# Patient Record
Sex: Female | Born: 1970
Health system: Southern US, Community
[De-identification: ages and names within clinical notes are randomized; demographics above are authoritative.]

## PROBLEM LIST (undated history)

## (undated) DIAGNOSIS — K219 Gastro-esophageal reflux disease without esophagitis: Secondary | ICD-10-CM

## (undated) DIAGNOSIS — G2581 Restless legs syndrome: Secondary | ICD-10-CM

## (undated) DIAGNOSIS — E7800 Pure hypercholesterolemia, unspecified: Secondary | ICD-10-CM

## (undated) DIAGNOSIS — I10 Essential (primary) hypertension: Secondary | ICD-10-CM

## (undated) DIAGNOSIS — F329 Major depressive disorder, single episode, unspecified: Secondary | ICD-10-CM

## (undated) DIAGNOSIS — K2270 Barrett's esophagus without dysplasia: Secondary | ICD-10-CM

## (undated) DIAGNOSIS — G4730 Sleep apnea, unspecified: Secondary | ICD-10-CM

## (undated) DIAGNOSIS — G8929 Other chronic pain: Secondary | ICD-10-CM

## (undated) DIAGNOSIS — G35 Multiple sclerosis: Secondary | ICD-10-CM

## (undated) DIAGNOSIS — F32A Depression, unspecified: Secondary | ICD-10-CM

## (undated) HISTORY — PX: ABDOMINAL HYSTERECTOMY: SHX81

## (undated) HISTORY — DX: Pure hypercholesterolemia, unspecified: E78.00

## (undated) HISTORY — DX: Depression, unspecified: F32.A

## (undated) HISTORY — DX: Other chronic pain: G89.29

## (undated) HISTORY — DX: Major depressive disorder, single episode, unspecified: F32.9

## (undated) HISTORY — DX: Gastro-esophageal reflux disease without esophagitis: K21.9

## (undated) HISTORY — DX: Essential (primary) hypertension: I10

## (undated) HISTORY — DX: Multiple sclerosis: G35

## (undated) HISTORY — DX: Barrett's esophagus without dysplasia: K22.70

## (undated) HISTORY — PX: TONSILLECTOMY: SUR1361

## (undated) HISTORY — DX: Restless legs syndrome: G25.81

## (undated) HISTORY — PX: TUBAL LIGATION: SHX77

---

## 2007-05-27 ENCOUNTER — Emergency Department (HOSPITAL_COMMUNITY): Admission: EM | Admit: 2007-05-27 | Discharge: 2007-05-27 | Payer: Self-pay | Admitting: Emergency Medicine

## 2010-10-31 LAB — CBC
HCT: 35.3 — ABNORMAL LOW
Hemoglobin: 12
MCHC: 34.1
MCV: 82.1
Platelets: 296
RBC: 4.3
RDW: 13.1
WBC: 6

## 2010-10-31 LAB — BASIC METABOLIC PANEL
BUN: 8
CO2: 28
Calcium: 9.1
Chloride: 105
Creatinine, Ser: 0.51
GFR calc Af Amer: >60
GFR calc non Af Amer: >60
Glucose, Bld: 104 — ABNORMAL HIGH
Potassium: 4.4
Sodium: 138

## 2010-10-31 LAB — DIFFERENTIAL
Basophils Absolute: 0
Basophils Relative: 1
Eosinophils Absolute: 0.2
Eosinophils Relative: 3
Lymphocytes Relative: 29
Lymphs Abs: 1.7
Monocytes Absolute: 0.6
Monocytes Relative: 10
Neutro Abs: 3.4
Neutrophils Relative %: 57

## 2011-08-03 DIAGNOSIS — F32A Depression, unspecified: Secondary | ICD-10-CM | POA: Insufficient documentation

## 2011-08-03 DIAGNOSIS — G35 Multiple sclerosis: Secondary | ICD-10-CM | POA: Insufficient documentation

## 2011-08-03 DIAGNOSIS — G35D Multiple sclerosis, unspecified: Secondary | ICD-10-CM | POA: Insufficient documentation

## 2011-08-03 DIAGNOSIS — I10 Essential (primary) hypertension: Secondary | ICD-10-CM | POA: Insufficient documentation

## 2011-08-03 DIAGNOSIS — K589 Irritable bowel syndrome without diarrhea: Secondary | ICD-10-CM | POA: Insufficient documentation

## 2011-09-20 DIAGNOSIS — E559 Vitamin D deficiency, unspecified: Secondary | ICD-10-CM | POA: Insufficient documentation

## 2012-01-02 DIAGNOSIS — H33329 Round hole, unspecified eye: Secondary | ICD-10-CM | POA: Insufficient documentation

## 2014-02-05 HISTORY — PX: ESOPHAGOGASTRODUODENOSCOPY: SHX1529

## 2015-10-12 DIAGNOSIS — G35 Multiple sclerosis: Secondary | ICD-10-CM | POA: Diagnosis not present

## 2015-10-12 DIAGNOSIS — H04129 Dry eye syndrome of unspecified lacrimal gland: Secondary | ICD-10-CM | POA: Diagnosis not present

## 2015-10-12 DIAGNOSIS — H35412 Lattice degeneration of retina, left eye: Secondary | ICD-10-CM | POA: Diagnosis not present

## 2015-11-18 DIAGNOSIS — G35 Multiple sclerosis: Secondary | ICD-10-CM | POA: Diagnosis not present

## 2015-11-18 DIAGNOSIS — Z23 Encounter for immunization: Secondary | ICD-10-CM | POA: Diagnosis not present

## 2015-11-18 DIAGNOSIS — E049 Nontoxic goiter, unspecified: Secondary | ICD-10-CM | POA: Diagnosis not present

## 2015-11-24 DIAGNOSIS — G35 Multiple sclerosis: Secondary | ICD-10-CM | POA: Diagnosis not present

## 2015-11-24 DIAGNOSIS — R9082 White matter disease, unspecified: Secondary | ICD-10-CM | POA: Diagnosis not present

## 2015-11-25 DIAGNOSIS — F3341 Major depressive disorder, recurrent, in partial remission: Secondary | ICD-10-CM | POA: Diagnosis not present

## 2015-11-25 DIAGNOSIS — Z6835 Body mass index (BMI) 35.0-35.9, adult: Secondary | ICD-10-CM | POA: Diagnosis not present

## 2015-11-25 DIAGNOSIS — R0789 Other chest pain: Secondary | ICD-10-CM | POA: Diagnosis not present

## 2015-11-25 DIAGNOSIS — I10 Essential (primary) hypertension: Secondary | ICD-10-CM | POA: Diagnosis not present

## 2015-11-25 DIAGNOSIS — K219 Gastro-esophageal reflux disease without esophagitis: Secondary | ICD-10-CM | POA: Diagnosis not present

## 2015-11-25 DIAGNOSIS — G35 Multiple sclerosis: Secondary | ICD-10-CM | POA: Diagnosis not present

## 2015-11-25 DIAGNOSIS — R202 Paresthesia of skin: Secondary | ICD-10-CM | POA: Diagnosis not present

## 2015-11-25 DIAGNOSIS — K589 Irritable bowel syndrome without diarrhea: Secondary | ICD-10-CM | POA: Diagnosis not present

## 2015-11-25 DIAGNOSIS — Z79899 Other long term (current) drug therapy: Secondary | ICD-10-CM | POA: Diagnosis not present

## 2015-12-07 DIAGNOSIS — Z79899 Other long term (current) drug therapy: Secondary | ICD-10-CM | POA: Diagnosis not present

## 2015-12-07 DIAGNOSIS — N3941 Urge incontinence: Secondary | ICD-10-CM | POA: Diagnosis not present

## 2015-12-07 DIAGNOSIS — R2689 Other abnormalities of gait and mobility: Secondary | ICD-10-CM | POA: Diagnosis not present

## 2015-12-07 DIAGNOSIS — I10 Essential (primary) hypertension: Secondary | ICD-10-CM | POA: Diagnosis not present

## 2015-12-07 DIAGNOSIS — E559 Vitamin D deficiency, unspecified: Secondary | ICD-10-CM | POA: Diagnosis not present

## 2015-12-07 DIAGNOSIS — F329 Major depressive disorder, single episode, unspecified: Secondary | ICD-10-CM | POA: Diagnosis not present

## 2015-12-07 DIAGNOSIS — Z880 Allergy status to penicillin: Secondary | ICD-10-CM | POA: Diagnosis not present

## 2015-12-07 DIAGNOSIS — R29898 Other symptoms and signs involving the musculoskeletal system: Secondary | ICD-10-CM | POA: Diagnosis not present

## 2015-12-07 DIAGNOSIS — Z8249 Family history of ischemic heart disease and other diseases of the circulatory system: Secondary | ICD-10-CM | POA: Diagnosis not present

## 2015-12-07 DIAGNOSIS — M62838 Other muscle spasm: Secondary | ICD-10-CM | POA: Diagnosis not present

## 2015-12-07 DIAGNOSIS — M549 Dorsalgia, unspecified: Secondary | ICD-10-CM | POA: Diagnosis not present

## 2015-12-07 DIAGNOSIS — Z791 Long term (current) use of non-steroidal anti-inflammatories (NSAID): Secondary | ICD-10-CM | POA: Diagnosis not present

## 2015-12-07 DIAGNOSIS — G4700 Insomnia, unspecified: Secondary | ICD-10-CM | POA: Diagnosis not present

## 2015-12-07 DIAGNOSIS — G35 Multiple sclerosis: Secondary | ICD-10-CM | POA: Diagnosis not present

## 2015-12-07 DIAGNOSIS — R5383 Other fatigue: Secondary | ICD-10-CM | POA: Diagnosis not present

## 2015-12-07 DIAGNOSIS — Z6835 Body mass index (BMI) 35.0-35.9, adult: Secondary | ICD-10-CM | POA: Diagnosis not present

## 2015-12-07 DIAGNOSIS — G629 Polyneuropathy, unspecified: Secondary | ICD-10-CM | POA: Diagnosis not present

## 2015-12-22 DIAGNOSIS — N3946 Mixed incontinence: Secondary | ICD-10-CM | POA: Diagnosis not present

## 2015-12-22 DIAGNOSIS — Z6834 Body mass index (BMI) 34.0-34.9, adult: Secondary | ICD-10-CM | POA: Diagnosis not present

## 2016-01-26 DIAGNOSIS — Z79899 Other long term (current) drug therapy: Secondary | ICD-10-CM | POA: Diagnosis not present

## 2016-01-26 DIAGNOSIS — E559 Vitamin D deficiency, unspecified: Secondary | ICD-10-CM | POA: Diagnosis not present

## 2016-01-26 DIAGNOSIS — E049 Nontoxic goiter, unspecified: Secondary | ICD-10-CM | POA: Diagnosis not present

## 2016-01-26 DIAGNOSIS — I10 Essential (primary) hypertension: Secondary | ICD-10-CM | POA: Diagnosis not present

## 2016-01-26 DIAGNOSIS — E782 Mixed hyperlipidemia: Secondary | ICD-10-CM | POA: Diagnosis not present

## 2016-01-26 DIAGNOSIS — R739 Hyperglycemia, unspecified: Secondary | ICD-10-CM | POA: Diagnosis not present

## 2016-01-31 DIAGNOSIS — G35 Multiple sclerosis: Secondary | ICD-10-CM | POA: Diagnosis not present

## 2016-01-31 DIAGNOSIS — M1990 Unspecified osteoarthritis, unspecified site: Secondary | ICD-10-CM | POA: Diagnosis not present

## 2016-01-31 DIAGNOSIS — Z6836 Body mass index (BMI) 36.0-36.9, adult: Secondary | ICD-10-CM | POA: Diagnosis not present

## 2016-01-31 DIAGNOSIS — M1288 Other specific arthropathies, not elsewhere classified, other specified site: Secondary | ICD-10-CM | POA: Diagnosis not present

## 2016-02-10 DIAGNOSIS — I10 Essential (primary) hypertension: Secondary | ICD-10-CM | POA: Diagnosis not present

## 2016-02-10 DIAGNOSIS — E049 Nontoxic goiter, unspecified: Secondary | ICD-10-CM | POA: Diagnosis not present

## 2016-02-10 DIAGNOSIS — E782 Mixed hyperlipidemia: Secondary | ICD-10-CM | POA: Diagnosis not present

## 2016-02-10 DIAGNOSIS — R739 Hyperglycemia, unspecified: Secondary | ICD-10-CM | POA: Diagnosis not present

## 2016-03-26 DIAGNOSIS — R438 Other disturbances of smell and taste: Secondary | ICD-10-CM | POA: Diagnosis not present

## 2016-03-26 DIAGNOSIS — R110 Nausea: Secondary | ICD-10-CM | POA: Diagnosis not present

## 2016-03-28 DIAGNOSIS — M1990 Unspecified osteoarthritis, unspecified site: Secondary | ICD-10-CM | POA: Diagnosis not present

## 2016-03-28 DIAGNOSIS — G35 Multiple sclerosis: Secondary | ICD-10-CM | POA: Diagnosis not present

## 2016-03-28 DIAGNOSIS — M4686 Other specified inflammatory spondylopathies, lumbar region: Secondary | ICD-10-CM | POA: Diagnosis not present

## 2016-04-06 DIAGNOSIS — Z6835 Body mass index (BMI) 35.0-35.9, adult: Secondary | ICD-10-CM | POA: Diagnosis not present

## 2016-04-06 DIAGNOSIS — G35 Multiple sclerosis: Secondary | ICD-10-CM | POA: Diagnosis not present

## 2016-04-06 DIAGNOSIS — F3341 Major depressive disorder, recurrent, in partial remission: Secondary | ICD-10-CM | POA: Diagnosis not present

## 2016-04-17 DIAGNOSIS — G35 Multiple sclerosis: Secondary | ICD-10-CM | POA: Diagnosis not present

## 2016-04-17 DIAGNOSIS — M792 Neuralgia and neuritis, unspecified: Secondary | ICD-10-CM | POA: Diagnosis not present

## 2016-04-17 DIAGNOSIS — Z6835 Body mass index (BMI) 35.0-35.9, adult: Secondary | ICD-10-CM | POA: Diagnosis not present

## 2016-04-19 DIAGNOSIS — I10 Essential (primary) hypertension: Secondary | ICD-10-CM | POA: Diagnosis not present

## 2016-04-19 DIAGNOSIS — Z880 Allergy status to penicillin: Secondary | ICD-10-CM | POA: Diagnosis not present

## 2016-04-19 DIAGNOSIS — M62830 Muscle spasm of back: Secondary | ICD-10-CM | POA: Diagnosis not present

## 2016-04-19 DIAGNOSIS — F329 Major depressive disorder, single episode, unspecified: Secondary | ICD-10-CM | POA: Diagnosis not present

## 2016-05-25 DIAGNOSIS — Z8249 Family history of ischemic heart disease and other diseases of the circulatory system: Secondary | ICD-10-CM | POA: Diagnosis not present

## 2016-05-25 DIAGNOSIS — G35 Multiple sclerosis: Secondary | ICD-10-CM | POA: Diagnosis not present

## 2016-05-25 DIAGNOSIS — Z836 Family history of other diseases of the respiratory system: Secondary | ICD-10-CM | POA: Diagnosis not present

## 2016-05-25 DIAGNOSIS — M549 Dorsalgia, unspecified: Secondary | ICD-10-CM | POA: Diagnosis not present

## 2016-05-25 DIAGNOSIS — Z820 Family history of epilepsy and other diseases of the nervous system: Secondary | ICD-10-CM | POA: Diagnosis not present

## 2016-05-25 DIAGNOSIS — M792 Neuralgia and neuritis, unspecified: Secondary | ICD-10-CM | POA: Diagnosis not present

## 2016-05-25 DIAGNOSIS — M6281 Muscle weakness (generalized): Secondary | ICD-10-CM | POA: Diagnosis not present

## 2016-05-25 DIAGNOSIS — Z841 Family history of disorders of kidney and ureter: Secondary | ICD-10-CM | POA: Diagnosis not present

## 2016-05-25 DIAGNOSIS — Z90710 Acquired absence of both cervix and uterus: Secondary | ICD-10-CM | POA: Diagnosis not present

## 2016-05-25 DIAGNOSIS — R293 Abnormal posture: Secondary | ICD-10-CM | POA: Diagnosis not present

## 2016-05-25 DIAGNOSIS — R2689 Other abnormalities of gait and mobility: Secondary | ICD-10-CM | POA: Diagnosis not present

## 2016-05-25 DIAGNOSIS — Z880 Allergy status to penicillin: Secondary | ICD-10-CM | POA: Diagnosis not present

## 2016-05-25 DIAGNOSIS — Z811 Family history of alcohol abuse and dependence: Secondary | ICD-10-CM | POA: Diagnosis not present

## 2016-05-25 DIAGNOSIS — Z79899 Other long term (current) drug therapy: Secondary | ICD-10-CM | POA: Diagnosis not present

## 2016-05-25 DIAGNOSIS — I10 Essential (primary) hypertension: Secondary | ICD-10-CM | POA: Diagnosis not present

## 2016-05-25 DIAGNOSIS — Z791 Long term (current) use of non-steroidal anti-inflammatories (NSAID): Secondary | ICD-10-CM | POA: Diagnosis not present

## 2016-06-04 DIAGNOSIS — Z820 Family history of epilepsy and other diseases of the nervous system: Secondary | ICD-10-CM | POA: Diagnosis not present

## 2016-06-04 DIAGNOSIS — Z79899 Other long term (current) drug therapy: Secondary | ICD-10-CM | POA: Diagnosis not present

## 2016-06-04 DIAGNOSIS — Z836 Family history of other diseases of the respiratory system: Secondary | ICD-10-CM | POA: Diagnosis not present

## 2016-06-04 DIAGNOSIS — R2689 Other abnormalities of gait and mobility: Secondary | ICD-10-CM | POA: Diagnosis not present

## 2016-06-04 DIAGNOSIS — R293 Abnormal posture: Secondary | ICD-10-CM | POA: Diagnosis not present

## 2016-06-04 DIAGNOSIS — Z90710 Acquired absence of both cervix and uterus: Secondary | ICD-10-CM | POA: Diagnosis not present

## 2016-06-04 DIAGNOSIS — Z841 Family history of disorders of kidney and ureter: Secondary | ICD-10-CM | POA: Diagnosis not present

## 2016-06-04 DIAGNOSIS — Z791 Long term (current) use of non-steroidal anti-inflammatories (NSAID): Secondary | ICD-10-CM | POA: Diagnosis not present

## 2016-06-04 DIAGNOSIS — Z8249 Family history of ischemic heart disease and other diseases of the circulatory system: Secondary | ICD-10-CM | POA: Diagnosis not present

## 2016-06-04 DIAGNOSIS — I10 Essential (primary) hypertension: Secondary | ICD-10-CM | POA: Diagnosis not present

## 2016-06-04 DIAGNOSIS — Z880 Allergy status to penicillin: Secondary | ICD-10-CM | POA: Diagnosis not present

## 2016-06-04 DIAGNOSIS — G35 Multiple sclerosis: Secondary | ICD-10-CM | POA: Diagnosis not present

## 2016-06-04 DIAGNOSIS — M6281 Muscle weakness (generalized): Secondary | ICD-10-CM | POA: Diagnosis not present

## 2016-06-04 DIAGNOSIS — Z811 Family history of alcohol abuse and dependence: Secondary | ICD-10-CM | POA: Diagnosis not present

## 2016-06-04 DIAGNOSIS — M549 Dorsalgia, unspecified: Secondary | ICD-10-CM | POA: Diagnosis not present

## 2016-06-04 DIAGNOSIS — M792 Neuralgia and neuritis, unspecified: Secondary | ICD-10-CM | POA: Diagnosis not present

## 2016-06-26 DIAGNOSIS — Z818 Family history of other mental and behavioral disorders: Secondary | ICD-10-CM | POA: Diagnosis not present

## 2016-06-26 DIAGNOSIS — K219 Gastro-esophageal reflux disease without esophagitis: Secondary | ICD-10-CM | POA: Diagnosis not present

## 2016-06-26 DIAGNOSIS — Z79891 Long term (current) use of opiate analgesic: Secondary | ICD-10-CM | POA: Diagnosis not present

## 2016-06-26 DIAGNOSIS — Z880 Allergy status to penicillin: Secondary | ICD-10-CM | POA: Diagnosis not present

## 2016-06-26 DIAGNOSIS — G4700 Insomnia, unspecified: Secondary | ICD-10-CM | POA: Diagnosis not present

## 2016-06-26 DIAGNOSIS — G35 Multiple sclerosis: Secondary | ICD-10-CM | POA: Diagnosis not present

## 2016-06-26 DIAGNOSIS — E559 Vitamin D deficiency, unspecified: Secondary | ICD-10-CM | POA: Diagnosis not present

## 2016-06-26 DIAGNOSIS — Z90710 Acquired absence of both cervix and uterus: Secondary | ICD-10-CM | POA: Diagnosis not present

## 2016-06-26 DIAGNOSIS — K589 Irritable bowel syndrome without diarrhea: Secondary | ICD-10-CM | POA: Diagnosis not present

## 2016-06-26 DIAGNOSIS — F3289 Other specified depressive episodes: Secondary | ICD-10-CM | POA: Diagnosis not present

## 2016-06-26 DIAGNOSIS — I10 Essential (primary) hypertension: Secondary | ICD-10-CM | POA: Diagnosis not present

## 2016-06-26 DIAGNOSIS — Z6835 Body mass index (BMI) 35.0-35.9, adult: Secondary | ICD-10-CM | POA: Diagnosis not present

## 2016-06-26 DIAGNOSIS — M792 Neuralgia and neuritis, unspecified: Secondary | ICD-10-CM | POA: Diagnosis not present

## 2016-06-26 DIAGNOSIS — Z820 Family history of epilepsy and other diseases of the nervous system: Secondary | ICD-10-CM | POA: Diagnosis not present

## 2016-06-26 DIAGNOSIS — Z79899 Other long term (current) drug therapy: Secondary | ICD-10-CM | POA: Diagnosis not present

## 2016-06-26 DIAGNOSIS — Z87440 Personal history of urinary (tract) infections: Secondary | ICD-10-CM | POA: Diagnosis not present

## 2016-07-05 DIAGNOSIS — I10 Essential (primary) hypertension: Secondary | ICD-10-CM | POA: Diagnosis not present

## 2016-07-05 DIAGNOSIS — Z79899 Other long term (current) drug therapy: Secondary | ICD-10-CM | POA: Diagnosis not present

## 2016-07-05 DIAGNOSIS — Z836 Family history of other diseases of the respiratory system: Secondary | ICD-10-CM | POA: Diagnosis not present

## 2016-07-05 DIAGNOSIS — Z820 Family history of epilepsy and other diseases of the nervous system: Secondary | ICD-10-CM | POA: Diagnosis not present

## 2016-07-05 DIAGNOSIS — Z811 Family history of alcohol abuse and dependence: Secondary | ICD-10-CM | POA: Diagnosis not present

## 2016-07-05 DIAGNOSIS — G35 Multiple sclerosis: Secondary | ICD-10-CM | POA: Diagnosis not present

## 2016-07-05 DIAGNOSIS — R293 Abnormal posture: Secondary | ICD-10-CM | POA: Diagnosis not present

## 2016-07-05 DIAGNOSIS — Z880 Allergy status to penicillin: Secondary | ICD-10-CM | POA: Diagnosis not present

## 2016-07-05 DIAGNOSIS — Z791 Long term (current) use of non-steroidal anti-inflammatories (NSAID): Secondary | ICD-10-CM | POA: Diagnosis not present

## 2016-07-05 DIAGNOSIS — Z8249 Family history of ischemic heart disease and other diseases of the circulatory system: Secondary | ICD-10-CM | POA: Diagnosis not present

## 2016-07-05 DIAGNOSIS — Z841 Family history of disorders of kidney and ureter: Secondary | ICD-10-CM | POA: Diagnosis not present

## 2016-07-05 DIAGNOSIS — Z90710 Acquired absence of both cervix and uterus: Secondary | ICD-10-CM | POA: Diagnosis not present

## 2016-07-05 DIAGNOSIS — M792 Neuralgia and neuritis, unspecified: Secondary | ICD-10-CM | POA: Diagnosis not present

## 2016-07-05 DIAGNOSIS — M6281 Muscle weakness (generalized): Secondary | ICD-10-CM | POA: Diagnosis not present

## 2016-07-05 DIAGNOSIS — R2689 Other abnormalities of gait and mobility: Secondary | ICD-10-CM | POA: Diagnosis not present

## 2016-07-05 DIAGNOSIS — M549 Dorsalgia, unspecified: Secondary | ICD-10-CM | POA: Diagnosis not present

## 2016-07-09 DIAGNOSIS — M792 Neuralgia and neuritis, unspecified: Secondary | ICD-10-CM | POA: Diagnosis not present

## 2016-07-09 DIAGNOSIS — Z6835 Body mass index (BMI) 35.0-35.9, adult: Secondary | ICD-10-CM | POA: Diagnosis not present

## 2016-08-01 DIAGNOSIS — H35412 Lattice degeneration of retina, left eye: Secondary | ICD-10-CM | POA: Diagnosis not present

## 2016-08-01 DIAGNOSIS — G35 Multiple sclerosis: Secondary | ICD-10-CM | POA: Diagnosis not present

## 2016-08-01 DIAGNOSIS — H04129 Dry eye syndrome of unspecified lacrimal gland: Secondary | ICD-10-CM | POA: Diagnosis not present

## 2016-08-09 DIAGNOSIS — Z79899 Other long term (current) drug therapy: Secondary | ICD-10-CM | POA: Diagnosis not present

## 2016-08-09 DIAGNOSIS — R739 Hyperglycemia, unspecified: Secondary | ICD-10-CM | POA: Diagnosis not present

## 2016-08-09 DIAGNOSIS — I10 Essential (primary) hypertension: Secondary | ICD-10-CM | POA: Diagnosis not present

## 2016-08-09 DIAGNOSIS — E782 Mixed hyperlipidemia: Secondary | ICD-10-CM | POA: Diagnosis not present

## 2016-08-09 DIAGNOSIS — E049 Nontoxic goiter, unspecified: Secondary | ICD-10-CM | POA: Diagnosis not present

## 2016-08-09 DIAGNOSIS — E559 Vitamin D deficiency, unspecified: Secondary | ICD-10-CM | POA: Diagnosis not present

## 2016-08-17 DIAGNOSIS — E049 Nontoxic goiter, unspecified: Secondary | ICD-10-CM | POA: Diagnosis not present

## 2016-08-17 DIAGNOSIS — R739 Hyperglycemia, unspecified: Secondary | ICD-10-CM | POA: Diagnosis not present

## 2016-08-17 DIAGNOSIS — E782 Mixed hyperlipidemia: Secondary | ICD-10-CM | POA: Diagnosis not present

## 2016-08-17 DIAGNOSIS — I10 Essential (primary) hypertension: Secondary | ICD-10-CM | POA: Diagnosis not present

## 2016-09-10 DIAGNOSIS — R079 Chest pain, unspecified: Secondary | ICD-10-CM | POA: Diagnosis not present

## 2016-09-10 DIAGNOSIS — I517 Cardiomegaly: Secondary | ICD-10-CM | POA: Diagnosis not present

## 2016-09-10 DIAGNOSIS — R109 Unspecified abdominal pain: Secondary | ICD-10-CM | POA: Diagnosis not present

## 2016-09-10 DIAGNOSIS — M545 Low back pain: Secondary | ICD-10-CM | POA: Diagnosis not present

## 2016-09-10 DIAGNOSIS — R9431 Abnormal electrocardiogram [ECG] [EKG]: Secondary | ICD-10-CM | POA: Diagnosis not present

## 2016-09-11 DIAGNOSIS — Z6836 Body mass index (BMI) 36.0-36.9, adult: Secondary | ICD-10-CM | POA: Diagnosis not present

## 2016-09-11 DIAGNOSIS — M792 Neuralgia and neuritis, unspecified: Secondary | ICD-10-CM | POA: Diagnosis not present

## 2016-09-12 ENCOUNTER — Encounter: Payer: Self-pay | Admitting: Gastroenterology

## 2016-10-17 DIAGNOSIS — H15111 Episcleritis periodica fugax, right eye: Secondary | ICD-10-CM | POA: Diagnosis not present

## 2016-10-19 DIAGNOSIS — J019 Acute sinusitis, unspecified: Secondary | ICD-10-CM | POA: Diagnosis not present

## 2016-10-25 DIAGNOSIS — N3946 Mixed incontinence: Secondary | ICD-10-CM | POA: Diagnosis not present

## 2016-10-25 DIAGNOSIS — N319 Neuromuscular dysfunction of bladder, unspecified: Secondary | ICD-10-CM | POA: Diagnosis not present

## 2016-10-31 DIAGNOSIS — Z79899 Other long term (current) drug therapy: Secondary | ICD-10-CM | POA: Diagnosis not present

## 2016-10-31 DIAGNOSIS — M545 Low back pain: Secondary | ICD-10-CM | POA: Diagnosis not present

## 2016-10-31 DIAGNOSIS — R52 Pain, unspecified: Secondary | ICD-10-CM | POA: Diagnosis not present

## 2016-10-31 DIAGNOSIS — Z90710 Acquired absence of both cervix and uterus: Secondary | ICD-10-CM | POA: Diagnosis not present

## 2016-10-31 DIAGNOSIS — F410 Panic disorder [episodic paroxysmal anxiety] without agoraphobia: Secondary | ICD-10-CM | POA: Diagnosis not present

## 2016-10-31 DIAGNOSIS — F331 Major depressive disorder, recurrent, moderate: Secondary | ICD-10-CM | POA: Diagnosis not present

## 2016-10-31 DIAGNOSIS — F411 Generalized anxiety disorder: Secondary | ICD-10-CM | POA: Diagnosis not present

## 2016-11-02 DIAGNOSIS — G4700 Insomnia, unspecified: Secondary | ICD-10-CM | POA: Diagnosis not present

## 2016-11-02 DIAGNOSIS — F3341 Major depressive disorder, recurrent, in partial remission: Secondary | ICD-10-CM | POA: Diagnosis not present

## 2016-11-02 DIAGNOSIS — Z6834 Body mass index (BMI) 34.0-34.9, adult: Secondary | ICD-10-CM | POA: Diagnosis not present

## 2016-11-02 DIAGNOSIS — G35 Multiple sclerosis: Secondary | ICD-10-CM | POA: Diagnosis not present

## 2016-11-05 DIAGNOSIS — G35 Multiple sclerosis: Secondary | ICD-10-CM | POA: Diagnosis not present

## 2016-11-05 DIAGNOSIS — R9082 White matter disease, unspecified: Secondary | ICD-10-CM | POA: Diagnosis not present

## 2016-11-05 DIAGNOSIS — H7491 Unspecified disorder of right middle ear and mastoid: Secondary | ICD-10-CM | POA: Diagnosis not present

## 2016-11-05 DIAGNOSIS — J329 Chronic sinusitis, unspecified: Secondary | ICD-10-CM | POA: Diagnosis not present

## 2016-11-06 DIAGNOSIS — M542 Cervicalgia: Secondary | ICD-10-CM | POA: Diagnosis not present

## 2016-11-06 DIAGNOSIS — F329 Major depressive disorder, single episode, unspecified: Secondary | ICD-10-CM | POA: Diagnosis not present

## 2016-11-06 DIAGNOSIS — M79644 Pain in right finger(s): Secondary | ICD-10-CM | POA: Diagnosis not present

## 2016-11-06 DIAGNOSIS — Z880 Allergy status to penicillin: Secondary | ICD-10-CM | POA: Diagnosis not present

## 2016-11-06 DIAGNOSIS — Z79899 Other long term (current) drug therapy: Secondary | ICD-10-CM | POA: Diagnosis not present

## 2016-11-06 DIAGNOSIS — G35 Multiple sclerosis: Secondary | ICD-10-CM | POA: Diagnosis not present

## 2016-11-06 DIAGNOSIS — M25562 Pain in left knee: Secondary | ICD-10-CM | POA: Diagnosis not present

## 2016-11-06 DIAGNOSIS — R159 Full incontinence of feces: Secondary | ICD-10-CM | POA: Diagnosis not present

## 2016-11-06 DIAGNOSIS — E559 Vitamin D deficiency, unspecified: Secondary | ICD-10-CM | POA: Diagnosis not present

## 2016-11-06 DIAGNOSIS — R9082 White matter disease, unspecified: Secondary | ICD-10-CM | POA: Diagnosis not present

## 2016-11-06 DIAGNOSIS — M5442 Lumbago with sciatica, left side: Secondary | ICD-10-CM | POA: Diagnosis not present

## 2016-11-06 DIAGNOSIS — I10 Essential (primary) hypertension: Secondary | ICD-10-CM | POA: Diagnosis not present

## 2016-11-06 DIAGNOSIS — M25561 Pain in right knee: Secondary | ICD-10-CM | POA: Diagnosis not present

## 2016-11-06 DIAGNOSIS — M5135 Other intervertebral disc degeneration, thoracolumbar region: Secondary | ICD-10-CM | POA: Diagnosis not present

## 2016-11-06 DIAGNOSIS — G8929 Other chronic pain: Secondary | ICD-10-CM | POA: Diagnosis not present

## 2016-11-06 DIAGNOSIS — R002 Palpitations: Secondary | ICD-10-CM | POA: Diagnosis not present

## 2016-11-06 DIAGNOSIS — R6889 Other general symptoms and signs: Secondary | ICD-10-CM | POA: Diagnosis not present

## 2016-11-06 DIAGNOSIS — M79645 Pain in left finger(s): Secondary | ICD-10-CM | POA: Diagnosis not present

## 2016-11-14 ENCOUNTER — Ambulatory Visit (INDEPENDENT_AMBULATORY_CARE_PROVIDER_SITE_OTHER): Payer: BLUE CROSS/BLUE SHIELD | Admitting: Gastroenterology

## 2016-11-14 ENCOUNTER — Encounter: Payer: Self-pay | Admitting: Gastroenterology

## 2016-11-14 VITALS — BP 123/84 | HR 67 | Temp 97.1°F | Ht 64.0 in | Wt 200.8 lb

## 2016-11-14 DIAGNOSIS — K2270 Barrett's esophagus without dysplasia: Secondary | ICD-10-CM | POA: Diagnosis not present

## 2016-11-14 DIAGNOSIS — R1030 Lower abdominal pain, unspecified: Secondary | ICD-10-CM | POA: Diagnosis not present

## 2016-11-14 DIAGNOSIS — K5900 Constipation, unspecified: Secondary | ICD-10-CM | POA: Diagnosis not present

## 2016-11-14 DIAGNOSIS — R1310 Dysphagia, unspecified: Secondary | ICD-10-CM | POA: Diagnosis not present

## 2016-11-14 NOTE — Progress Notes (Addendum)
REVIEWED-NO ADDITIONAL RECOMMENDATIONS.Marland Kitchen  Primary Care Physician:  Erasmo Downer, NP Primary Gastroenterologist:  Dr. Darrick Penna   Chief Complaint  Patient presents with  . Abdominal Pain    w/ eating and has bloating  . Gastroesophageal Reflux    x many years  . Dysphagia    x 3 years    HPI:   Madison Oliver is a 46 y.o. female presenting today at the request of Erasmo Downer, NP secondary to dysphagia.    History of Barrett's esophagus per patient report. She believes she was diagnosed a long time ago and doesn't remember which practice diagnosed her. Last dilatation by Dr. Samuella Cota a few years ago.   States she notes intermittent dysphagia with swallowing. Happens with spit, liquids, solid foods. No odynophagia. Will get choked at times. Prilosec daily but takes it with breakfast. GERD seems to be controlled. Naproxen just rarely if still has leg pain in the evenings after scheduled medications.   Notes lower abdominal discomfort. Hasn't eaten anything this morning and feels bloated. Feels tight. Sometimes constipated, sometimes diarrhea. Lately has been more constipated. Historically would have postprandial urgency, which is not happening now. Takes metamucil. Sometimes straining, feeling like her muscles aren't doing what they need to do. No rectal bleeding. No weight loss or lack of appetite. Last colonoscopy with Dr. Samuella Cota several years ago. Not sure if any polyps.   Past Medical History:  Diagnosis Date  . Barrett esophagus   . Chronic pain   . Depression   . GERD (gastroesophageal reflux disease)   . HTN (hypertension)   . Hypercholesterolemia   . MS (multiple sclerosis) (HCC)   . RLS (restless legs syndrome)     Past Surgical History:  Procedure Laterality Date  . ABDOMINAL HYSTERECTOMY     still has ovaries  . TONSILLECTOMY    . TUBAL LIGATION      Current Outpatient Prescriptions  Medication Sig Dispense Refill  . Amantadine HCl 100 MG tablet Take  100 mg by mouth 2 (two) times daily.    Marland Kitchen azelastine (ASTELIN) 0.1 % nasal spray Place 1 spray into both nostrils daily.    . baclofen (LIORESAL) 10 MG tablet Take 10 mg by mouth 2 (two) times daily.    . carbamazepine (TEGRETOL XR) 100 MG 12 hr tablet Take 200 mg by mouth 2 (two) times daily.    . carvedilol (COREG) 3.125 MG tablet Take 3.125 mg by mouth 2 (two) times daily.    . Cholecalciferol 4000 units CAPS Take 4,000 Units by mouth daily.    . citalopram (CELEXA) 20 MG tablet Take 10 mg by mouth daily.    . Cream Base (Q-DERM EX) Inject 0.6 mg into the skin daily.    . Dimethyl Fumarate 240 MG CPDR Take 240 mg by mouth 2 (two) times daily.    Marland Kitchen losartan-hydrochlorothiazide (HYZAAR) 100-25 MG tablet daily.    . mirabegron ER (MYRBETRIQ) 25 MG TB24 tablet Take 25 mg by mouth daily.    . naproxen (NAPROSYN) 500 MG tablet Take 500 mg by mouth as needed.    Marland Kitchen omeprazole (PRILOSEC) 20 MG capsule Take 20 mg by mouth daily before breakfast.    . pregabalin (LYRICA) 75 MG capsule 75 mg 2 (two) times daily.    . simvastatin (ZOCOR) 40 MG tablet Take 40 mg by mouth daily.    Marland Kitchen venlafaxine XR (EFFEXOR-XR) 75 MG 24 hr capsule Take 225 mg by mouth daily.    Marland Kitchen  zolpidem (AMBIEN) 10 MG tablet Take 10 mg by mouth at bedtime as needed.     No current facility-administered medications for this visit.     Allergies as of 11/14/2016 - Review Complete 11/14/2016  Allergen Reaction Noted  . Penicillins Other (See Comments) 05/28/2012    Family History  Problem Relation Age of Onset  . Colon polyps Mother 58  . Colon cancer Neg Hx     Social History   Social History  . Marital status: Married    Spouse name: N/A  . Number of children: N/A  . Years of education: N/A   Occupational History  . disability    Social History Main Topics  . Smoking status: Never Smoker  . Smokeless tobacco: Never Used  . Alcohol use No  . Drug use: No  . Sexual activity: Not on file   Other Topics Concern  .  Not on file   Social History Narrative  . No narrative on file    Review of Systems: Gen: Denies any fever, chills, fatigue, weight loss, lack of appetite.  CV: Denies chest pain, heart palpitations, peripheral edema, syncope.  Resp: Denies shortness of breath at rest or with exertion. Denies wheezing or cough.  GI:see HPI  GU : Denies urinary burning, urinary frequency, urinary hesitancy MS: Denies joint pain, muscle weakness, cramps, or limitation of movement.  Derm: Denies rash, itching, dry skin Psych: Denies depression, anxiety, memory loss, and confusion Heme: Denies bruising, bleeding, and enlarged lymph nodes.  Physical Exam: BP 123/84   Pulse 67   Temp (!) 97.1 F (36.2 C) (Oral)   Ht 5\' 4"  (1.626 m)   Wt 200 lb 12.8 oz (91.1 kg)   BMI 34.47 kg/m  General:   Alert and oriented. Pleasant and cooperative. Well-nourished and well-developed.  Head:  Normocephalic and atraumatic. Eyes:  Without icterus, sclera clear and conjunctiva pink.  Ears:  Normal auditory acuity. Nose:  No deformity, discharge,  or lesions. Mouth:  No deformity or lesions, oral mucosa pink.  Lungs:  Clear to auscultation bilaterally. No wheezes, rales, or rhonchi. No distress.  Heart:  S1, S2 present without murmurs appreciated.  Abdomen:  +BS, soft, TTP epigastric/LUQ, and lower abdomen and non-distended. No HSM noted. No guarding or rebound. No masses appreciated.  Rectal:  Deferred  Msk:  Symmetrical without gross deformities. Normal posture. Extremities:  Without edema. Neurologic:  Alert and  oriented x4 Psych:  Alert and cooperative. Normal mood and affect.

## 2016-11-14 NOTE — Assessment & Plan Note (Signed)
46 year old female with dysphagia to liquids and solid foods, with reported dilatation by Dr. Samuella Cota several years ago. Also notes a history of Barrett's esophagus, but we need to request records. Will need EGD with dilatation in very near future, but I am requesting records in case she needs a colonoscopy as well. Will utilize Propofol due to polypharmacy. I have asked her to take Prilosec 30 minutes before breakfast, as she has inadvertently been taking this incorrectly.

## 2016-11-14 NOTE — Assessment & Plan Note (Signed)
Vague lower abdominal discomfort, bloating, in setting of constipation. Likely secondary to IBS-C. Start on Linzess 145 mcg once daily. Samples provided. Colonoscopy reportedly a few years ago. Will request records. She relays multiple colonoscopies in the past every year for unknown reasons, as there is not a family history of colon cancer or personal history of polyps. Mother with polyps at age 46. Will need to sort this out further. Obtain colonoscopy reports and then decide if colonoscopy is needed at time of EGD.

## 2016-11-14 NOTE — Patient Instructions (Signed)
Continue Prilosec but make sure it is 30 minutes before breakfast daily. It is best absorbed this way.  For constipation: start Linzess 1 capsule each morning, 30 minutes before breakfast. I feel like this is where your abdominal cramping/bloating may be coming from. If not, we will need to investigate further.  I am getting records from Dr. Samuella Cota. You definitely need an upper endoscopy in the near future, but I will see if you need a colonoscopy as well.

## 2016-11-14 NOTE — Assessment & Plan Note (Signed)
Likely due for surveillance. Obtaining records.

## 2016-11-14 NOTE — Progress Notes (Signed)
cc'ed to pcp °

## 2016-11-19 ENCOUNTER — Telehealth: Payer: Self-pay | Admitting: Gastroenterology

## 2016-11-19 NOTE — Telephone Encounter (Signed)
I re-faxed the request ASAP to Dr Marina Goodell.

## 2016-11-19 NOTE — Telephone Encounter (Signed)
Have we received any outside reports from EGD and colonoscopy? Need to have ASAP so we can see what procedures she actually needs. Thanks!

## 2016-11-26 NOTE — Telephone Encounter (Signed)
Pt called- she wanted to know if you have gotten any information from her previous GI doc? And if she is going to be scheduled for anything? She can be reached at [phone] to schedule.   Tobi Bastos, did you want to schedule anything for this pt?

## 2016-11-26 NOTE — Telephone Encounter (Signed)
Called pt. She is aware of her options. She states that she seen Dr. Edmonia Caprio (not Dr. Marina Goodell). Advised her I would let SS know that she didn't see Dr. Marina Goodell. She asked if we could hold a spot on schedule. Holding spot 12/25/16.

## 2016-11-26 NOTE — Telephone Encounter (Signed)
Madison Oliver, will you discuss options with the pt please.

## 2016-11-26 NOTE — Telephone Encounter (Signed)
I have not gotten anything.   We have two options:  1. EGD with dilatation only (Propofol) with Dr. Darrick PennaFields. She needs this regardless.   2. EGD/dilatation, with colonoscopy at time of EGD as we don't know when her last colonoscopy actually was. We have had difficulty obtaining these records. She could wait on colonoscopy until a later date, but I hate to have her do two different procedures.   Just let me know. I am sorry it is taking awhile. It is out of our hands regarding obtaining records as we have requested them ASAP.

## 2016-11-28 NOTE — Telephone Encounter (Signed)
I faxed request ASAP to Dr Hendricks Milo office

## 2016-12-07 DIAGNOSIS — F410 Panic disorder [episodic paroxysmal anxiety] without agoraphobia: Secondary | ICD-10-CM | POA: Diagnosis not present

## 2016-12-07 DIAGNOSIS — M545 Low back pain: Secondary | ICD-10-CM | POA: Diagnosis not present

## 2016-12-07 DIAGNOSIS — Z6835 Body mass index (BMI) 35.0-35.9, adult: Secondary | ICD-10-CM | POA: Diagnosis not present

## 2016-12-07 DIAGNOSIS — F331 Major depressive disorder, recurrent, moderate: Secondary | ICD-10-CM | POA: Diagnosis not present

## 2016-12-07 DIAGNOSIS — M792 Neuralgia and neuritis, unspecified: Secondary | ICD-10-CM | POA: Diagnosis not present

## 2016-12-07 DIAGNOSIS — F411 Generalized anxiety disorder: Secondary | ICD-10-CM | POA: Diagnosis not present

## 2016-12-07 DIAGNOSIS — R52 Pain, unspecified: Secondary | ICD-10-CM | POA: Diagnosis not present

## 2016-12-07 DIAGNOSIS — G8929 Other chronic pain: Secondary | ICD-10-CM | POA: Diagnosis not present

## 2016-12-14 DIAGNOSIS — G35 Multiple sclerosis: Secondary | ICD-10-CM | POA: Diagnosis not present

## 2016-12-14 DIAGNOSIS — Z6835 Body mass index (BMI) 35.0-35.9, adult: Secondary | ICD-10-CM | POA: Diagnosis not present

## 2016-12-14 DIAGNOSIS — M545 Low back pain: Secondary | ICD-10-CM | POA: Diagnosis not present

## 2016-12-14 DIAGNOSIS — G8929 Other chronic pain: Secondary | ICD-10-CM | POA: Diagnosis not present

## 2016-12-14 DIAGNOSIS — F329 Major depressive disorder, single episode, unspecified: Secondary | ICD-10-CM | POA: Diagnosis not present

## 2016-12-17 NOTE — Telephone Encounter (Signed)
I have sent another request ASAP

## 2016-12-17 NOTE — Telephone Encounter (Signed)
Have records been received from Dr. Hendricks Milo office?

## 2016-12-18 NOTE — Telephone Encounter (Signed)
Mother had polyps around age 46. We do not know if these were advanced polyps or not. To my knowledge, I don't believe patient has a history of polyps herself. I have not actually been able to obtain the last colonoscopy in 2013 from Dr. Aleene Davidson.   As the colonoscopy history if somewhat unclear, we can go ahead with colonoscopy/EGD/diltaation with Propofol with Dr. Darrick Penna.

## 2016-12-18 NOTE — Telephone Encounter (Signed)
EGD was done in Aug 2016. Noted hiatal hernia, gastritis, esophageal stricture. Flex sig in 2016 normal, with biopsies taken to rule out microscopic colitis. THEY DID NOT SEND PATH.  Notes from their office stated Dr. Aleene DavidsonSpainhour completed a colonoscopy in 2013 that was normal. I don't have actual report.

## 2016-12-19 ENCOUNTER — Other Ambulatory Visit: Payer: Self-pay

## 2016-12-19 DIAGNOSIS — R1310 Dysphagia, unspecified: Secondary | ICD-10-CM

## 2016-12-19 DIAGNOSIS — K2270 Barrett's esophagus without dysplasia: Secondary | ICD-10-CM

## 2016-12-19 DIAGNOSIS — R1030 Lower abdominal pain, unspecified: Secondary | ICD-10-CM

## 2016-12-19 DIAGNOSIS — Z8371 Family history of colonic polyps: Secondary | ICD-10-CM

## 2016-12-19 MED ORDER — PEG 3350-KCL-NA BICARB-NACL 420 G PO SOLR: 4000.0000 mL | ORAL | 0 refills | Status: DC

## 2016-12-19 NOTE — Telephone Encounter (Signed)
Called and informed pt of pre-op appt 01/01/17 at 2:45pm. Letter mailed with procedure instructions.

## 2016-12-19 NOTE — Telephone Encounter (Signed)
Called and informed pt. TCS/EGD/DIL with Propofol w/SLF scheduled for 01/08/17 at 9:30am. Rx for prep sent to pharmacy. Orders entered. Will mail instructions after pre-op appt is scheduled.

## 2016-12-26 NOTE — Patient Instructions (Signed)
Madison LaundryJessica L Oliver  12/26/2016     @PREFPERIOPPHARMACY @   Your procedure is scheduled on 01/08/2017.  [facility].  Call this number if you have problems the morning of surgery:  [phone]   Remember:  Do not eat food or drink liquids after midnight.  Take these medicines the morning of surgery with A SIP OF WATER Effexor, Vesicare, Lyrica, Protonix, Myrbetriq, Tecfidera, Celexa, Baclofen  Coreg   Do not wear jewelry, make-up or nail polish.  Do not wear lotions, powders, or perfumes, or deoderant.  Do not shave 48 hours prior to surgery.  Men may shave face and neck.  Do not bring valuables to the hospital.  [facility] is not responsible for any belongings or valuables.  Contacts, dentures or bridgework may not be worn into surgery.  Leave your suitcase in the car.  After surgery it may be brought to your room.  For patients admitted to the hospital, discharge time will be determined by your treatment team.  Patients discharged the day of surgery will not be allowed to drive home.   Please read over the following fact sheets that you were given. Anesthesia Post-op Instructions     PATIENT INSTRUCTIONS POST-ANESTHESIA  IMMEDIATELY FOLLOWING SURGERY:  Do not drive or operate machinery for the first twenty four hours after surgery.  Do not make any important decisions for twenty four hours after surgery or while taking narcotic pain medications or sedatives.  If you develop intractable nausea and vomiting or a severe headache please notify your doctor immediately.  FOLLOW-UP:  Please make an appointment with your surgeon as instructed. You do not need to follow up with anesthesia unless specifically instructed to do so.  WOUND CARE INSTRUCTIONS (if applicable):  Keep a dry clean dressing on the anesthesia/puncture wound site if there is drainage.  Once the wound has quit draining you may leave it open to air.  Generally you should leave the bandage  intact for twenty four hours unless there is drainage.  If the epidural site drains for more than 36-48 hours please call the anesthesia department.  QUESTIONS?:  Please feel free to call your physician or the hospital operator if you have any questions, and they will be happy to assist you.      Esophagogastroduodenoscopy Esophagogastroduodenoscopy (EGD) is a procedure to examine the lining of the esophagus, stomach, and first part of the small intestine (duodenum). This procedure is done to check for problems such as inflammation, bleeding, ulcers, or growths. During this procedure, a long, flexible, lighted tube with a camera attached (endoscope) is inserted down the throat. Tell a health care provider about:  Any allergies you have.  All medicines you are taking, including vitamins, herbs, eye drops, creams, and over-the-counter medicines.  Any problems you or family members have had with anesthetic medicines.  Any blood disorders you have.  Any surgeries you have had.  Any medical conditions you have.  Whether you are pregnant or may be pregnant. What are the risks? Generally, this is a safe procedure. However, problems may occur, including:  Infection.  Bleeding.  A tear (perforation) in the esophagus, stomach, or duodenum.  Trouble breathing.  Excessive sweating.  Spasms of the larynx.  A slowed heartbeat.  Low blood pressure.  What happens before the procedure?  Follow instructions from your health care provider about eating or drinking restrictions.  Ask your health care provider about: ? Changing or stopping your regular medicines. This  is especially important if you are taking diabetes medicines or blood thinners. ? Taking medicines such as aspirin and ibuprofen. These medicines can thin your blood. Do not take these medicines before your procedure if your health care provider instructs you not to.  Plan to have someone take you home after the  procedure.  If you wear dentures, be ready to remove them before the procedure. What happens during the procedure?  To reduce your risk of infection, your health care team will wash or sanitize their hands.  An IV tube will be put in a vein in your hand or arm. You will get medicines and fluids through this tube.  You will be given one or more of the following: ? A medicine to help you relax (sedative). ? A medicine to numb the area (local anesthetic). This medicine may be sprayed into your throat. It will make you feel more comfortable and keep you from gagging or coughing during the procedure. ? A medicine for pain.  A mouth guard may be placed in your mouth to protect your teeth and to keep you from biting on the endoscope.  You will be asked to lie on your left side.  The endoscope will be lowered down your throat into your esophagus, stomach, and duodenum.  Air will be put into the endoscope. This will help your health care provider see better.  The lining of your esophagus, stomach, and duodenum will be examined.  Your health care provider may: ? Take a tissue sample so it can be looked at in a lab (biopsy). ? Remove growths. ? Remove objects (foreign bodies) that are stuck. ? Treat any bleeding with medicines or other devices that stop tissue from bleeding. ? Widen (dilate) or stretch narrowed areas of your esophagus and stomach.  The endoscope will be taken out. The procedure may vary among health care providers and hospitals. What happens after the procedure?  Your blood pressure, heart rate, breathing rate, and blood oxygen level will be monitored often until the medicines you were given have worn off.  Do not eat or drink anything until the numbing medicine has worn off and your gag reflex has returned. This information is not intended to replace advice given to you by your health care provider. Make sure you discuss any questions you have with your health care  provider. Document Released: 05/25/2004 Document Revised: 06/30/2015 Document Reviewed: 12/16/2014 Elsevier Interactive Patient Education  2018 [employer]. Esophageal Dilatation Esophageal dilatation is a procedure to open a blocked or narrowed part of the esophagus. The esophagus is the long tube in your throat that carries food and liquid from your mouth to your stomach. The procedure is also called esophageal dilation. You may need this procedure if you have a buildup of scar tissue in your esophagus that makes it difficult, painful, or even impossible to swallow. This can be caused by gastroesophageal reflux disease (GERD). In rare cases, people need this procedure because they have cancer of the esophagus or a problem with the way food moves through the esophagus. Sometimes you may need to have another dilatation to enlarge the opening of the esophagus gradually. Tell a health care provider about:  Any allergies you have.  All medicines you are taking, including vitamins, herbs, eye drops, creams, and over-the-counter medicines.  Any problems you or family members have had with anesthetic medicines.  Any blood disorders you have.  Any surgeries you have had.  Any medical conditions you have.  Any  antibiotic medicines you are required to take before dental procedures. What are the risks? Generally, this is a safe procedure. However, problems can occur and include:  Bleeding from a tear in the lining of the esophagus.  A hole (perforation) in the esophagus.  What happens before the procedure?  Do not eat or drink anything after midnight on the night before the procedure or as directed by your health care provider.  Ask your health care provider about changing or stopping your regular medicines. This is especially important if you are taking diabetes medicines or blood thinners.  Plan to have someone take you home after the procedure. What happens during the procedure?  You  will be given a medicine that makes you relaxed and sleepy (sedative).  A medicine may be sprayed or gargled to numb the back of the throat.  Your health care provider can use various instruments to do an esophageal dilatation. During the procedure, the instrument used will be placed in your mouth and passed down into your esophagus. Options include: ? Simple dilators. This instrument is carefully placed in the esophagus to stretch it. ? Guided wire bougies. In this method, a flexible tube (endoscope) is used to insert a wire into the esophagus. The dilator is passed over this wire to enlarge the esophagus. Then the wire is removed. ? Balloon dilators. An endoscope with a small balloon at the end is passed down into the esophagus. Inflating the balloon gently stretches the esophagus and opens it up. What happens after the procedure?  Your blood pressure, heart rate, breathing rate, and blood oxygen level will be monitored often until the medicines you were given have worn off.  Your throat may feel slightly sore and will probably still feel numb. This will improve slowly over time.  You will not be allowed to eat or drink until the throat numbness has resolved.  If this is a same-day procedure, you may be allowed to go home once you have been able to drink, urinate, and sit on the edge of the bed without nausea or dizziness.  If this is a same-day procedure, you should have a friend or family member with you for the next 24 hours after the procedure. This information is not intended to replace advice given to you by your health care provider. Make sure you discuss any questions you have with your health care provider. Document Released: 03/15/2005 Document Revised: 06/30/2015 Document Reviewed: 06/03/2013 Elsevier Interactive Patient Education  2017 [employer] Colonoscopy, Adult A colonoscopy is an exam to look at the entire large intestine. During the exam, a lubricated, bendable tube is  inserted into the anus and then passed into the rectum, colon, and other parts of the large intestine. A colonoscopy is often done as a part of normal colorectal screening or in response to certain symptoms, such as anemia, persistent diarrhea, abdominal pain, and blood in the stool. The exam can help screen for and diagnose medical problems, including:  Tumors.  Polyps.  Inflammation.  Areas of bleeding.  Tell a health care provider about:  Any allergies you have.  All medicines you are taking, including vitamins, herbs, eye drops, creams, and over-the-counter medicines.  Any problems you or family members have had with anesthetic medicines.  Any blood disorders you have.  Any surgeries you have had.  Any medical conditions you have.  Any problems you have had passing stool. What are the risks? Generally, this is a safe procedure. However, problems may occur, including:  Bleeding.  A tear in the intestine.  A reaction to medicines given during the exam.  Infection (rare).  What happens before the procedure? Eating and drinking restrictions Follow instructions from your health care provider about eating and drinking, which may include:  A few days before the procedure - follow a low-fiber diet. Avoid nuts, seeds, dried fruit, raw fruits, and vegetables.  1-3 days before the procedure - follow a clear liquid diet. Drink only clear liquids, such as clear broth or bouillon, black coffee or tea, clear juice, clear soft drinks or sports drinks, gelatin dessert, and popsicles. Avoid any liquids that contain red or purple dye.  On the day of the procedure - do not eat or drink anything during the 2 hours before the procedure, or within the time period that your health care provider recommends.  Bowel prep If you were prescribed an oral bowel prep to clean out your colon:  Take it as told by your health care provider. Starting the day before your procedure, you will need to  drink a large amount of medicated liquid. The liquid will cause you to have multiple loose stools until your stool is almost clear or light green.  If your skin or anus gets irritated from diarrhea, you may use these to relieve the irritation: ? Medicated wipes, such as adult wet wipes with aloe and vitamin E. ? A skin soothing-product like petroleum jelly.  If you vomit while drinking the bowel prep, take a break for up to 60 minutes and then begin the bowel prep again. If vomiting continues and you cannot take the bowel prep without vomiting, call your health care provider.  General instructions  Ask your health care provider about changing or stopping your regular medicines. This is especially important if you are taking diabetes medicines or blood thinners.  Plan to have someone take you home from the hospital or clinic. What happens during the procedure?  An IV tube may be inserted into one of your veins.  You will be given medicine to help you relax (sedative).  To reduce your risk of infection: ? Your health care team will wash or sanitize their hands. ? Your anal area will be washed with soap.  You will be asked to lie on your side with your knees bent.  Your health care provider will lubricate a long, thin, flexible tube. The tube will have a camera and a light on the end.  The tube will be inserted into your anus.  The tube will be gently eased through your rectum and colon.  Air will be delivered into your colon to keep it open. You may feel some pressure or cramping.  The camera will be used to take images during the procedure.  A small tissue sample may be removed from your body to be examined under a microscope (biopsy). If any potential problems are found, the tissue will be sent to a lab for testing.  If small polyps are found, your health care provider may remove them and have them checked for cancer cells.  The tube that was inserted into your anus will be  slowly removed. The procedure may vary among health care providers and hospitals. What happens after the procedure?  Your blood pressure, heart rate, breathing rate, and blood oxygen level will be monitored until the medicines you were given have worn off.  Do not drive for 24 hours after the exam.  You may have a small amount of blood in your  stool.  You may pass gas and have mild abdominal cramping or bloating due to the air that was used to inflate your colon during the exam.  It is up to you to get the results of your procedure. Ask your health care provider, or the department performing the procedure, when your results will be ready. This information is not intended to replace advice given to you by your health care provider. Make sure you discuss any questions you have with your health care provider. Document Released: 01/20/2000 Document Revised: 11/23/2015 Document Reviewed: 04/05/2015 Elsevier Interactive Patient Education  2018 [employer].

## 2016-12-29 DIAGNOSIS — K219 Gastro-esophageal reflux disease without esophagitis: Secondary | ICD-10-CM | POA: Diagnosis not present

## 2016-12-29 DIAGNOSIS — Z880 Allergy status to penicillin: Secondary | ICD-10-CM | POA: Diagnosis not present

## 2016-12-29 DIAGNOSIS — G8929 Other chronic pain: Secondary | ICD-10-CM | POA: Diagnosis not present

## 2016-12-29 DIAGNOSIS — M549 Dorsalgia, unspecified: Secondary | ICD-10-CM | POA: Diagnosis not present

## 2016-12-29 DIAGNOSIS — I10 Essential (primary) hypertension: Secondary | ICD-10-CM | POA: Diagnosis not present

## 2016-12-29 DIAGNOSIS — G35 Multiple sclerosis: Secondary | ICD-10-CM | POA: Diagnosis not present

## 2016-12-29 DIAGNOSIS — K589 Irritable bowel syndrome without diarrhea: Secondary | ICD-10-CM | POA: Diagnosis not present

## 2016-12-29 DIAGNOSIS — Z79899 Other long term (current) drug therapy: Secondary | ICD-10-CM | POA: Diagnosis not present

## 2016-12-29 DIAGNOSIS — M5432 Sciatica, left side: Secondary | ICD-10-CM | POA: Diagnosis not present

## 2016-12-29 DIAGNOSIS — G4700 Insomnia, unspecified: Secondary | ICD-10-CM | POA: Diagnosis not present

## 2016-12-29 DIAGNOSIS — F329 Major depressive disorder, single episode, unspecified: Secondary | ICD-10-CM | POA: Diagnosis not present

## 2016-12-31 DIAGNOSIS — N3946 Mixed incontinence: Secondary | ICD-10-CM | POA: Diagnosis not present

## 2016-12-31 DIAGNOSIS — M6281 Muscle weakness (generalized): Secondary | ICD-10-CM | POA: Diagnosis not present

## 2016-12-31 DIAGNOSIS — M62838 Other muscle spasm: Secondary | ICD-10-CM | POA: Diagnosis not present

## 2017-01-01 ENCOUNTER — Other Ambulatory Visit: Payer: Self-pay

## 2017-01-01 ENCOUNTER — Encounter (HOSPITAL_COMMUNITY): Payer: Self-pay

## 2017-01-01 ENCOUNTER — Encounter (HOSPITAL_COMMUNITY)
Admission: RE | Admit: 2017-01-01 | Discharge: 2017-01-01 | Disposition: A | Discharge status: Home / Self Care | Payer: BLUE CROSS/BLUE SHIELD | Source: Ambulatory Visit | Attending: Gastroenterology | Admitting: Gastroenterology

## 2017-01-01 DIAGNOSIS — Z01818 Encounter for other preprocedural examination: Secondary | ICD-10-CM | POA: Diagnosis not present

## 2017-01-01 DIAGNOSIS — Z01812 Encounter for preprocedural laboratory examination: Secondary | ICD-10-CM | POA: Diagnosis not present

## 2017-01-01 HISTORY — DX: Sleep apnea, unspecified: G47.30

## 2017-01-01 LAB — CBC WITH DIFFERENTIAL/PLATELET
Basophils Absolute: 0 10*3/uL (ref 0.0–0.1)
Basophils Relative: 0 %
Eosinophils Absolute: 0.1 10*3/uL (ref 0.0–0.7)
Eosinophils Relative: 1 %
HCT: 35.8 % — ABNORMAL LOW (ref 36.0–46.0)
Hemoglobin: 11.2 g/dL — ABNORMAL LOW (ref 12.0–15.0)
Lymphocytes Relative: 39 %
Lymphs Abs: 4 10*3/uL (ref 0.7–4.0)
MCH: 27.3 pg (ref 26.0–34.0)
MCHC: 31.3 g/dL (ref 30.0–36.0)
MCV: 87.1 fL (ref 78.0–100.0)
Monocytes Absolute: 0.6 10*3/uL (ref 0.1–1.0)
Monocytes Relative: 6 %
Neutro Abs: 5.7 10*3/uL (ref 1.7–7.7)
Neutrophils Relative %: 54 %
Platelets: 328 10*3/uL (ref 150–400)
RBC: 4.11 MIL/uL (ref 3.87–5.11)
RDW: 13.7 % (ref 11.5–15.5)
WBC: 10.4 10*3/uL (ref 4.0–10.5)

## 2017-01-01 LAB — BASIC METABOLIC PANEL
Anion gap: 10 (ref 5–15)
BUN: 19 mg/dL (ref 6–20)
CO2: 24 mmol/L (ref 22–32)
Calcium: 9 mg/dL (ref 8.9–10.3)
Chloride: 102 mmol/L (ref 101–111)
Creatinine, Ser: 0.63 mg/dL (ref 0.44–1.00)
GFR calc Af Amer: >60 mL/min (ref >60)
GFR calc non Af Amer: >60 mL/min (ref >60)
Glucose, Bld: 96 mg/dL (ref 65–99)
Potassium: 3.2 mmol/L — ABNORMAL LOW (ref 3.5–5.1)
Sodium: 136 mmol/L (ref 135–145)

## 2017-01-02 ENCOUNTER — Telehealth: Payer: Self-pay | Admitting: Gastroenterology

## 2017-01-02 MED ORDER — POTASSIUM CHLORIDE ER 20 MEQ PO TBCR: EXTENDED_RELEASE_TABLET | ORAL | 0 refills | Status: DC

## 2017-01-02 NOTE — Telephone Encounter (Signed)
PLEASE CALL PT. HE HAS A LOW POTASSIUM. 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

## 2017-01-06 NOTE — Progress Notes (Signed)
After multiple attempts, unable to obtain colonoscopy reports that were reportedly completed in 2013. Only has had flex sig that we were able to retrieve in 2016 that was normal. EGD in Aug 2016 with hiatal hernia, gastirtis, esophageal stricture. Mother with polyps around age 46, but it is unknown if advanced adenomas or not. Unclear if patient has a personal history of polyps, as we were not able to obtain any colonoscopy reports from 2013 by Dr. Aleene DavidsonSpainhour. Due to the unclear colorectal cancer screening timeline, will proceed with colonoscopy at time of EGD/dilatation with Propofol by Dr. Darrick PennaFields.

## 2017-01-08 ENCOUNTER — Ambulatory Visit (HOSPITAL_COMMUNITY): Payer: BLUE CROSS/BLUE SHIELD | Admitting: Anesthesiology

## 2017-01-08 ENCOUNTER — Other Ambulatory Visit: Payer: Self-pay

## 2017-01-08 ENCOUNTER — Encounter (HOSPITAL_COMMUNITY): Payer: Self-pay | Admitting: *Deleted

## 2017-01-08 ENCOUNTER — Ambulatory Visit (HOSPITAL_COMMUNITY)
Admission: RE | Admit: 2017-01-08 | Discharge: 2017-01-08 | Disposition: A | Discharge status: Home / Self Care | Payer: BLUE CROSS/BLUE SHIELD | Source: Ambulatory Visit | Attending: Gastroenterology | Admitting: Gastroenterology

## 2017-01-08 ENCOUNTER — Encounter (HOSPITAL_COMMUNITY)
Admission: RE | Disposition: A | Discharge status: Home / Self Care | Payer: Self-pay | Source: Ambulatory Visit | Attending: Gastroenterology

## 2017-01-08 DIAGNOSIS — K219 Gastro-esophageal reflux disease without esophagitis: Secondary | ICD-10-CM | POA: Diagnosis not present

## 2017-01-08 DIAGNOSIS — Q438 Other specified congenital malformations of intestine: Secondary | ICD-10-CM | POA: Diagnosis not present

## 2017-01-08 DIAGNOSIS — K2270 Barrett's esophagus without dysplasia: Secondary | ICD-10-CM | POA: Diagnosis not present

## 2017-01-08 DIAGNOSIS — Z8371 Family history of colonic polyps: Secondary | ICD-10-CM | POA: Diagnosis not present

## 2017-01-08 DIAGNOSIS — K648 Other hemorrhoids: Secondary | ICD-10-CM | POA: Diagnosis not present

## 2017-01-08 DIAGNOSIS — G35 Multiple sclerosis: Secondary | ICD-10-CM | POA: Diagnosis not present

## 2017-01-08 DIAGNOSIS — K222 Esophageal obstruction: Secondary | ICD-10-CM | POA: Diagnosis not present

## 2017-01-08 DIAGNOSIS — E7800 Pure hypercholesterolemia, unspecified: Secondary | ICD-10-CM | POA: Diagnosis not present

## 2017-01-08 DIAGNOSIS — Z800 Family history of malignant neoplasm of digestive organs: Secondary | ICD-10-CM | POA: Diagnosis not present

## 2017-01-08 DIAGNOSIS — K449 Diaphragmatic hernia without obstruction or gangrene: Secondary | ICD-10-CM | POA: Diagnosis not present

## 2017-01-08 DIAGNOSIS — G8929 Other chronic pain: Secondary | ICD-10-CM | POA: Diagnosis not present

## 2017-01-08 DIAGNOSIS — Z1211 Encounter for screening for malignant neoplasm of colon: Secondary | ICD-10-CM | POA: Diagnosis not present

## 2017-01-08 DIAGNOSIS — Z8719 Personal history of other diseases of the digestive system: Secondary | ICD-10-CM | POA: Diagnosis not present

## 2017-01-08 DIAGNOSIS — I10 Essential (primary) hypertension: Secondary | ICD-10-CM | POA: Diagnosis not present

## 2017-01-08 DIAGNOSIS — T39315A Adverse effect of propionic acid derivatives, initial encounter: Secondary | ICD-10-CM | POA: Diagnosis not present

## 2017-01-08 DIAGNOSIS — R1310 Dysphagia, unspecified: Secondary | ICD-10-CM | POA: Diagnosis not present

## 2017-01-08 DIAGNOSIS — K2950 Unspecified chronic gastritis without bleeding: Secondary | ICD-10-CM | POA: Diagnosis not present

## 2017-01-08 DIAGNOSIS — K2970 Gastritis, unspecified, without bleeding: Secondary | ICD-10-CM | POA: Diagnosis not present

## 2017-01-08 DIAGNOSIS — K644 Residual hemorrhoidal skin tags: Secondary | ICD-10-CM | POA: Diagnosis not present

## 2017-01-08 DIAGNOSIS — G4730 Sleep apnea, unspecified: Secondary | ICD-10-CM | POA: Insufficient documentation

## 2017-01-08 DIAGNOSIS — Z90710 Acquired absence of both cervix and uterus: Secondary | ICD-10-CM | POA: Insufficient documentation

## 2017-01-08 DIAGNOSIS — R1030 Lower abdominal pain, unspecified: Secondary | ICD-10-CM

## 2017-01-08 DIAGNOSIS — Z79899 Other long term (current) drug therapy: Secondary | ICD-10-CM | POA: Insufficient documentation

## 2017-01-08 DIAGNOSIS — K2990 Gastroduodenitis, unspecified, without bleeding: Secondary | ICD-10-CM

## 2017-01-08 DIAGNOSIS — G2581 Restless legs syndrome: Secondary | ICD-10-CM | POA: Diagnosis not present

## 2017-01-08 DIAGNOSIS — F329 Major depressive disorder, single episode, unspecified: Secondary | ICD-10-CM | POA: Insufficient documentation

## 2017-01-08 HISTORY — PX: ESOPHAGOGASTRODUODENOSCOPY (EGD) WITH PROPOFOL: SHX5813

## 2017-01-08 HISTORY — PX: BIOPSY: SHX5522

## 2017-01-08 HISTORY — PX: SAVORY DILATION: SHX5439

## 2017-01-08 HISTORY — PX: COLONOSCOPY WITH PROPOFOL: SHX5780

## 2017-01-08 SURGERY — COLONOSCOPY WITH PROPOFOL
Anesthesia: Monitor Anesthesia Care

## 2017-01-08 MED ORDER — CHLORHEXIDINE GLUCONATE CLOTH 2 % EX PADS: 6.0000 | MEDICATED_PAD | Freq: Once | CUTANEOUS | Status: DC

## 2017-01-08 MED ORDER — LACTATED RINGERS IV SOLN
INTRAVENOUS | Status: DC
  Administered 2017-01-08 (×2): via INTRAVENOUS

## 2017-01-08 MED ORDER — PROPOFOL 10 MG/ML IV BOLUS
INTRAVENOUS | Status: DC | PRN
  Administered 2017-01-08: 10 mg via INTRAVENOUS
  Administered 2017-01-08: 20 mg via INTRAVENOUS

## 2017-01-08 MED ORDER — PROPOFOL 500 MG/50ML IV EMUL
INTRAVENOUS | Status: DC | PRN
  Administered 2017-01-08: 150 ug/kg/min via INTRAVENOUS
  Administered 2017-01-08 (×2): via INTRAVENOUS

## 2017-01-08 MED ORDER — MIDAZOLAM HCL 2 MG/2ML IJ SOLN
INTRAMUSCULAR | Status: AC
  Filled 2017-01-08: qty 2

## 2017-01-08 MED ORDER — MINERAL OIL PO OIL
TOPICAL_OIL | ORAL | Status: AC
  Filled 2017-01-08: qty 30

## 2017-01-08 MED ORDER — PROPOFOL 10 MG/ML IV BOLUS
INTRAVENOUS | Status: AC
  Filled 2017-01-08: qty 60

## 2017-01-08 MED ORDER — LIDOCAINE VISCOUS 2 % MT SOLN
OROMUCOSAL | Status: AC
  Filled 2017-01-08: qty 15

## 2017-01-08 MED ORDER — LIDOCAINE VISCOUS 2 % MT SOLN
15.0000 mL | Freq: Once | OROMUCOSAL | Status: AC
  Administered 2017-01-08: 15 mL via OROMUCOSAL

## 2017-01-08 MED ORDER — MIDAZOLAM HCL 2 MG/2ML IJ SOLN
1.0000 mg | Freq: Once | INTRAMUSCULAR | Status: AC | PRN
  Administered 2017-01-08: 2 mg via INTRAVENOUS
  Filled 2017-01-08: qty 2

## 2017-01-08 MED ORDER — MIDAZOLAM HCL 5 MG/5ML IJ SOLN
INTRAMUSCULAR | Status: DC | PRN
  Administered 2017-01-08: 2 mg via INTRAVENOUS

## 2017-01-08 NOTE — Op Note (Signed)
[facility] Patient Name: Madison Oliver Procedure Date: 01/08/2017 9:57 AM MRN: [id number] Date of Birth: [birth year] Attending MD: Jonette Eva MD, MD CSN: [id number] Age: 46 Admit Type: Outpatient Procedure:                Colonoscopy, SCREENING Indications:              Screening in patient at increased risk: Family                            history of 1st-degree relative with colorectal                            cancer Providers:                Jonette Eva MD, MD, Nena Polio, RN, Tammy Vaught,                            RN Referring MD:              Medicines:                Propofol per Anesthesia Complications:            No immediate complications. Estimated Blood Loss:     Estimated blood loss: none. Procedure:                Pre-Anesthesia Assessment:                           - Prior to the procedure, a History and Physical                            was performed, and patient medications and                            allergies were reviewed. The patient's tolerance of                            previous anesthesia was also reviewed. The risks                            and benefits of the procedure and the sedation                            options and risks were discussed with the patient.                            All questions were answered, and informed consent                            was obtained. Prior Anticoagulants: The patient has                            taken naproxen, last dose was 1 day prior to                            procedure.  ASA Grade Assessment: II - A patient                            with mild systemic disease. After reviewing the                            risks and benefits, the patient was deemed in                            satisfactory condition to undergo the procedure.                            After obtaining informed consent, the colonoscope                            was passed under direct vision. Throughout the                      procedure, the patient's blood pressure, pulse, and                            oxygen saturations were monitored continuously. The                            EC-[id number] ([id number]) scope was introduced through                            the anus and advanced to the the cecum, identified                            by appendiceal orifice and ileocecal valve. The                            ileocecal valve, appendiceal orifice, and rectum                            were photographed. The colonoscopy was somewhat                            difficult due to a tortuous colon. Successful                            completion of the procedure was aided by COLOWRAP.                            The patient tolerated the procedure fairly well.                            The quality of the bowel preparation was good. Scope In: 10:31:05 AM Scope Out: 10:46:41 AM Scope Withdrawal Time: 0 hours 12 minutes 17 seconds  Total Procedure Duration: 0 hours 15 minutes 36 seconds  Findings:      The recto-sigmoid colon and sigmoid colon were moderately redundant.      The exam was otherwise without abnormality.  External and internal hemorrhoids were found during retroflexion. The       hemorrhoids were moderate. Impression:               - Redundant LEFT colon.                           - The examination was otherwise normal.                           - External and internal hemorrhoids. Moderate Sedation:      Per Anesthesia Care Recommendation:           - Repeat colonoscopy in 10 years for surveillance.                           - High fiber diet.                           - Continue present medications.                           - Patient has a contact number available for                            emergencies. The signs and symptoms of potential                            delayed complications were discussed with the                            patient. Return to normal activities  tomorrow.                            Written discharge instructions were provided to the                            patient. Procedure Code(s):        --- Professional ---                           [id number], Colonoscopy, flexible; diagnostic, including                            collection of specimen(s) by brushing or washing,                            when performed (separate procedure) Diagnosis Code(s):        --- Professional ---                           Z80.0, Family history of malignant neoplasm of                            digestive organs                           K64.8, Other hemorrhoids  Q43.8, Other specified congenital malformations of                            intestine CPT copyright 2016 [employer]. All rights reserved. The codes documented in this report are preliminary and upon coder review may  be revised to meet current compliance requirements. Jonette Eva, MD Jonette Eva MD, MD 01/08/2017 11:09:34 AM This report has been signed electronically. Number of Addenda: 0

## 2017-01-08 NOTE — H&P (Signed)
Primary Care Physician:  Erasmo DownerStrader, Lindsey F, NP Primary Gastroenterologist:  Dr. Darrick PennaFields  Pre-Procedure History & Physical: HPI:  Madison Oliver is a 46 y.o. female here for DYSPHAGIA/screening.  Past Medical History:  Diagnosis Date  . Barrett esophagus   . Chronic pain   . Depression   . GERD (gastroesophageal reflux disease)   . HTN (hypertension)   . Hypercholesterolemia   . MS (multiple sclerosis) (HCC)   . RLS (restless legs syndrome)   . Sleep apnea    not using CPAP; cannot tolerate, PCP not aware.    Past Surgical History:  Procedure Laterality Date  . ABDOMINAL HYSTERECTOMY     still has ovaries  . TONSILLECTOMY    . TUBAL LIGATION      Prior to Admission medications   Medication Sig Start Date End Date Taking? Authorizing Provider  baclofen (LIORESAL) 20 MG tablet Take 20 mg by mouth 4 (four) times daily.   Yes [provider]  carvedilol (COREG) 6.25 MG tablet Take 6.25 mg by mouth 2 (two) times daily with a meal.   Yes [provider]  Cholecalciferol (VITAMIN D3) 2000 units TABS Take 4,000 Units by mouth daily.   Yes [provider]  citalopram (CELEXA) 40 MG tablet Take 40 mg by mouth daily.   Yes [provider]  Dimethyl Fumarate (TECFIDERA) 240 MG CPDR Take 480 mg by mouth 2 (two) times daily.   Yes [provider]  lidocaine (LIDODERM) 5 % Place 1 patch onto the skin daily as needed (for back pain.). Remove & Discard patch within 12 hours or as directed by MD   Yes [provider]  losartan-hydrochlorothiazide (HYZAAR) 100-25 MG tablet Take 1 tablet by mouth daily.  06/11/16  Yes [provider]  mirabegron ER (MYRBETRIQ) 25 MG TB24 tablet Take 25 mg by mouth daily. 10/25/16  Yes [provider]  naproxen (NAPROSYN) 500 MG tablet Take 500 mg by mouth 2 (two) times daily as needed (for pain.).  12/07/15  Yes [provider]  pantoprazole (PROTONIX) 40 MG tablet Take 40 mg by mouth  daily before breakfast.   Yes [provider]  Polyethyl Glycol-Propyl Glycol (LUBRICANT EYE DROPS) 0.4-0.3 % SOLN Place 1-2 drops into both eyes 3 (three) times daily as needed (for dry eyes.).   Yes [provider]  polyethylene glycol-electrolytes (TRILYTE) 420 g solution Take 4,000 mLs as directed by mouth. 12/19/16  Yes Orlen Leedy L, MD  Potassium Chloride ER 20 MEQ TBCR 2 PO BID FOR 2 DAYS 01/02/17  Yes Yessika Otte, Darleene CleaverSandi L, MD  Pregabalin ER (LYRICA CR) 165 MG TB24 Take 165 mg by mouth 3 (three) times daily.   Yes [provider]  simvastatin (ZOCOR) 40 MG tablet Take 40 mg by mouth daily.   Yes [provider]  solifenacin (VESICARE) 5 MG tablet Take 5 mg by mouth daily.   Yes [provider]  venlafaxine XR (EFFEXOR-XR) 75 MG 24 hr capsule Take 75 mg by mouth 3 (three) times daily.  11/02/16 01/01/17 Yes [provider]  zolpidem (AMBIEN) 10 MG tablet Take 10 mg by mouth at bedtime.  06/26/16  Yes [provider]    Allergies as of 12/19/2016 - Review Complete 11/14/2016  Allergen Reaction Noted  . Penicillins Other (See Comments) 05/28/2012    Family History  Problem Relation Age of Onset  . Colon polyps Mother 3850  . Colon cancer Neg Hx     Social History  Socioeconomic History  . Marital status: Married    Spouse name: Not on file  . Number of children: Not on file  . Years of education: Not on file  . Highest education level: Not on file  Social Needs  . Financial resource strain: Not on file  . Food insecurity - worry: Not on file  . Food insecurity - inability: Not on file  . Transportation needs - medical: Not on file  . Transportation needs - non-medical: Not on file  Occupational History  . Occupation: disability  Tobacco Use  . Smoking status: Never Smoker  . Smokeless tobacco: Never Used  Substance and Sexual Activity  . Alcohol use: No  . Drug use: No  . Sexual activity: Yes    Birth  control/protection: Surgical  Other Topics Concern  . Not on file  Social History Narrative  . Not on file    Review of Systems: See HPI, otherwise negative ROS   Physical Exam: BP 135/85   Pulse 76   Temp 98.8 F (37.1 C) (Oral)   Resp 20  General:   Alert,  pleasant and cooperative in NAD Head:  Normocephalic and atraumatic. Neck:  Supple; Lungs:  Clear throughout to auscultation.    Heart:  Regular rate and rhythm. Abdomen:  Soft, nontender and nondistended. Normal bowel sounds, without guarding, and without rebound.   Neurologic:  Alert and  oriented x4;  grossly normal neurologically.  Impression/Plan:     DYSPHAGIA/screening  PLAN:  EGD/DIL/tcs TODAY DISCUSSED PROCEDURE, BENEFITS, & RISKS: < 1% chance of medication reaction, bleeding, perforation, or rupture of spleen/liver.

## 2017-01-08 NOTE — Transfer of Care (Signed)
Immediate Anesthesia Transfer of Care Note  Patient: Madison Oliver  Procedure(s) Performed: COLONOSCOPY WITH PROPOFOL (N/A ) ESOPHAGOGASTRODUODENOSCOPY (EGD) WITH PROPOFOL (N/A ) SAVORY DILATION (N/A ) BIOPSY  Patient Location: PACU  Anesthesia Type:MAC  Level of Consciousness: awake and alert   Airway & Oxygen Therapy: Patient Spontanous Breathing  Post-op Assessment: Report given to RN  Post vital signs: Reviewed and stable  Last Vitals:  Vitals:   01/08/17 0855 01/08/17 0900  BP: 115/74 114/69  Pulse:    Resp: 19 (!) 23  Temp:    SpO2: 100% 100%    Last Pain:  Vitals:   01/08/17 1020  TempSrc:   PainSc: 7       Patients Stated Pain Goal: 9 (12/26/60 4469)  Complications: No apparent anesthesia complications

## 2017-01-08 NOTE — Op Note (Signed)
[facility] Patient Name: Madison Oliver Procedure Date: 01/08/2017 10:48 AM MRN: [id number] Date of Birth: 1970/09/12 Attending MD: Jonette Eva MD, MD CSN: [id number] Age: 46 Admit Type: Outpatient Procedure:                Upper GI endoscopy WITH COLD FORCEPS                            BIOPSY/ESOPHAGEAL DILATION Indications:              Dysphagia Providers:                Jonette Eva MD, MD, Nena Polio, RN, Edrick Kins,                            RN Referring MD:              Medicines:                Propofol per Anesthesia Complications:            No immediate complications. Estimated Blood Loss:     Estimated blood loss was minimal. Procedure:                Pre-Anesthesia Assessment:                           - Prior to the procedure, a History and Physical                            was performed, and patient medications and                            allergies were reviewed. The patient's tolerance of                            previous anesthesia was also reviewed. The risks                            and benefits of the procedure and the sedation                            options and risks were discussed with the patient.                            All questions were answered, and informed consent                            was obtained. Prior Anticoagulants: The patient has                            taken naproxen, last dose was 1 day prior to                            procedure. ASA Grade Assessment: II - A patient  with mild systemic disease. After reviewing the                            risks and benefits, the patient was deemed in                            satisfactory condition to undergo the procedure.                            After obtaining informed consent, the endoscope was                            passed under direct vision. Throughout the                            procedure, the patient's blood pressure, pulse, and                             oxygen saturations were monitored continuously. The                            EG-[id number] ([id number]) scope was introduced through the                            mouth, and advanced to the second part of duodenum.                            The upper GI endoscopy was somewhat difficult due                            to the patient's agitation. Successful completion                            of the procedure was aided by increasing the dose                            of sedation medication. The patient tolerated the                            procedure fairly well. Scope In: 10:52:52 AM Scope Out: 10:59:48 AM Total Procedure Duration: 0 hours 6 minutes 56 seconds  Findings:      One mild (non-circumferential scarring) benign-appearing, intrinsic       stenosis was found. This measured 1.4 cm (inner diameter) and was       traversed. A guidewire was placed and the scope was withdrawn. Dilation       was performed with a Savary dilator with mild resistance at 16 mm and 17       mm. Estimated blood loss: none.      A small hiatal hernia was present.      Patchy mild inflammation characterized by congestion (edema), erosions       and erythema was found in the gastric antrum. Biopsies were taken with a       cold forceps for Helicobacter pylori testing.  Patchy mild inflammation characterized by congestion (edema) and       erythema was found in the duodenal bulb.      The second portion of the duodenum was normal. 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                           - MILD Gastritis/Duodenitis DUE TO NAPROXEN.                           - Moderate Sedation:      Per Anesthesia Care Recommendation:           - Await pathology results.                           - Return to my office in 4 months.                           - High fiber diet and low fat diet. LOSE  WEIGHT.                            AVOID REFLUX TRIGGERS.                           - Continue present medications. PROTONIX 30 MINS                            PRIOR TO BREAKFAST.                           - Patient has a contact number available for                            emergencies. The signs and symptoms of potential                            delayed complications were discussed with the                            patient. Return to normal activities tomorrow.                            Written discharge instructions were provided to the                            patient. Procedure Code(s):        --- Professional ---                           [phone], Esophagogastroduodenoscopy, flexible,                            transoral; with  insertion of guide wire followed by                            passage of dilator(s) through esophagus over guide                            wire                           [id number], Esophagogastroduodenoscopy, flexible,                            transoral; with biopsy, single or multiple Diagnosis Code(s):        --- Professional ---                           K22.2, Esophageal obstruction                           K44.9, Diaphragmatic hernia without obstruction or                            gangrene                           K29.70, Gastritis, unspecified, without bleeding                           K29.80, Duodenitis without bleeding                           R13.10, Dysphagia, unspecified CPT copyright 2016 [employer]. All rights reserved. The codes documented in this report are preliminary and upon coder review may  be revised to meet current compliance requirements. Jonette Eva, MD Jonette Eva MD, MD 01/08/2017 11:16:43 AM This report has been signed electronically. Number of Addenda: 0

## 2017-01-08 NOTE — Anesthesia Preprocedure Evaluation (Signed)
Anesthesia Evaluation  Patient identified by MRN, date of birth, ID band Patient awake    Airway Mallampati: I  TM Distance: >3 FB Neck ROM: Full    Dental  (+) Teeth Intact   Pulmonary sleep apnea ,    Pulmonary exam normal        Cardiovascular Exercise Tolerance: Good hypertension, Pt. on medications and Pt. on home beta blockers Normal cardiovascular exam Rhythm:Regular Rate:Normal  Normal sinus rhythm Normal ECG   (Nov 2018)   Neuro/Psych Depression    GI/Hepatic GERD  Medicated and Controlled,  Endo/Other    Renal/GU Results for BREELY, PANIK (MRN [id number]) as of 01/08/2017 07:51  01/01/2017 14:56 Sodium: 136 Potassium: 3.2 (L) Chloride: 102 CO2: 24 Glucose: 96 BUN: 19 Creatinine: 0.63      Musculoskeletal   Abdominal Normal abdominal exam  (+)   Peds  Hematology   Anesthesia Other Findings S/p hysterectomy  Reproductive/Obstetrics                             Anesthesia Physical Anesthesia Plan  ASA: II  Anesthesia Plan: MAC   Post-op Pain Management:    Induction:   PONV Risk Score and Plan:   Airway Management Planned: Nasal Cannula  Additional Equipment:   Intra-op Plan:   Post-operative Plan:   Informed Consent: I have reviewed the patients History and Physical, chart, labs and discussed the procedure including the risks, benefits and alternatives for the proposed anesthesia with the patient or authorized representative who has indicated his/her understanding and acceptance.   Dental advisory given  Plan Discussed with: CRNA  Anesthesia Plan Comments:         Anesthesia Quick Evaluation

## 2017-01-08 NOTE — Discharge Instructions (Signed)
YOU DID NOT HAVE ANY POLYPS. YOU DO NOT HAVE BARRETT'S ESOPHAGUS. YOU HAVE MODERATE EXTERNAL AND INTERNAL HEMORRHOIDS. YOU HAVE GASTRITIS. I STRETCHED YOUR ESOPHAGUS DUE YOUR PROBLEMS SWALLOWING. I BIOPSIED YOUR STOMACH.   DRINK WATER TO KEEP YOUR URINE LIGHT YELLOW.  CONTINUE YOUR WEIGHT LOSS EFFORTS. A WEIGHT OF 170 LBS  WILL GET YOUR BODY MASS INDEX(BMI) UNDER 30.  WHILE I DO NOT WANT TO ALARM YOU, YOUR BODY MASS INDEX IS OVER 30 WHICH MEANS YOU ARE OBESE. OBESITY CAN ACTIVATE CANCER GENES. OBESITY IS ASSOCIATED WITH AN INCREASED RISK FOR CIRRHOSIS AND ALL CANCERS, INCLUDING ESOPHAGEAL AND COLON CANCER.  FOLLOW A HIGH FIBER DIET. AVOID ITEMS THAT CAUSE BLOATING. SEE INFO BELOW.  YOUR BIOPSY RESULTS WILL BE AVAILABLE IN MY CHART AFTER DEC 8 AND MY OFFICE WILL CONTACT YOU IN 10-14 DAYS WITH YOUR RESULTS.   Follow up in 4 mos.  Next colonoscopy in 10 years.    ENDOSCOPY Care After Read the instructions outlined below and refer to this sheet in the next week. These discharge instructions provide you with general information on caring for yourself after you leave the hospital. While your treatment has been planned according to the most current medical practices available, unavoidable complications occasionally occur. If you have any problems or questions after discharge, call DR. Jaqlyn Gruenhagen, [phone].  ACTIVITY  You may resume your regular activity, but move at a slower pace for the next 24 hours.   Take frequent rest periods for the next 24 hours.   Walking will help get rid of the air and reduce the bloated feeling in your belly (abdomen).   No driving for 24 hours (because of the medicine (anesthesia) used during the test).   You may shower.   Do not sign any important legal documents or operate any machinery for 24 hours (because of the anesthesia used during the test).    NUTRITION  Drink plenty of fluids.   You may resume your normal diet as instructed by your doctor.    Begin with a light meal and progress to your normal diet. Heavy or fried foods are harder to digest and may make you feel sick to your stomach (nauseated).   Avoid alcoholic beverages for 24 hours or as instructed.    MEDICATIONS  You may resume your normal medications.   WHAT YOU CAN EXPECT TODAY  Some feelings of bloating in the abdomen.   Passage of more gas than usual.   Spotting of blood in your stool or on the toilet paper  .  IF YOU HAD POLYPS REMOVED DURING THE ENDOSCOPY:  Eat a soft diet IF YOU HAVE NAUSEA, BLOATING, ABDOMINAL PAIN, OR VOMITING.    FINDING OUT THE RESULTS OF YOUR TEST Not all test results are available during your visit. DR. Darrick Penna WILL CALL YOU WITHIN 14 DAYS OF YOUR PROCEDUE WITH YOUR RESULTS. Do not assume everything is normal if you have not heard from DR. Mikisha Roseland, CALL HER OFFICE AT [phone].  SEEK IMMEDIATE MEDICAL ATTENTION AND CALL THE OFFICE: [phone] IF:  You have more than a spotting of blood in your stool.   Your belly is swollen (abdominal distention).   You are nauseated or vomiting.   You have a temperature over 101F.   You have abdominal pain or discomfort that is severe or gets worse throughout the day.   Low-Fat Diet BREADS, CEREALS, PASTA, RICE, DRIED PEAS, AND BEANS These products are high in carbohydrates and most are low in fat. Therefore, they  can be increased in the diet as substitutes for fatty foods. They too, however, contain calories and should not be eaten in excess. Cereals can be eaten for snacks as well as for breakfast.   FRUITS AND VEGETABLES It is good to eat fruits and vegetables. Besides being sources of fiber, both are rich in vitamins and some minerals. They help you get the daily allowances of these nutrients. Fruits and vegetables can be used for snacks and desserts.  MEATS Limit lean meat, chicken, [place], and fish to no more than 6 ounces per day. Beef, Pork, and Lamb Use lean cuts of  beef, pork, and lamb. Lean cuts include:  Extra-lean ground beef.  Arm roast.  Sirloin tip.  Center-cut ham.  Round steak.  Loin chops.  Rump roast.  Tenderloin.  Trim all fat off the outside of meats before cooking. It is not necessary to severely decrease the intake of red meat, but lean choices should be made. Lean meat is rich in protein and contains a highly absorbable form of iron. Premenopausal women, in particular, should avoid reducing lean red meat because this could increase the risk for low red blood cells (iron-deficiency anemia).  Chicken and [place] These are good sources of protein. The fat of poultry can be reduced by removing the skin and underlying fat layers before cooking. Chicken and [place] can be substituted for lean red meat in the diet. Poultry should not be fried or covered with high-fat sauces. Fish and Shellfish Fish is a good source of protein. Shellfish contain cholesterol, but they usually are low in saturated fatty acids. The preparation of fish is important. Like chicken and [place], they should not be fried or covered with high-fat sauces. EGGS Egg whites contain no fat or cholesterol. They can be eaten often. Try 1 to 2 egg whites instead of whole eggs in recipes or use egg substitutes that do not contain yolk. MILK AND DAIRY PRODUCTS Use skim or 1% milk instead of 2% or whole milk. Decrease whole milk, natural, and processed cheeses. Use nonfat or low-fat (2%) cottage cheese or low-fat cheeses made from vegetable oils. Choose nonfat or low-fat (1 to 2%) yogurt. Experiment with evaporated skim milk in recipes that call for heavy cream. Substitute low-fat yogurt or low-fat cottage cheese for sour cream in dips and salad dressings. Have at least 2 servings of low-fat dairy products, such as 2 glasses of skim (or 1%) milk each day to help get your daily calcium intake. FATS AND OILS Reduce the total intake of fats, especially saturated fat. Butterfat, lard, and beef  fats are high in saturated fat and cholesterol. These should be avoided as much as possible. Vegetable fats do not contain cholesterol, but certain vegetable fats, such as coconut oil, palm oil, and palm kernel oil are very high in saturated fats. These should be limited. These fats are often used in bakery goods, processed foods, popcorn, oils, and nondairy creamers. Vegetable shortenings and some peanut butters contain hydrogenated oils, which are also saturated fats. Read the labels on these foods and check for saturated vegetable oils. Unsaturated vegetable oils and fats do not raise blood cholesterol. However, they should be limited because they are fats and are high in calories. Total fat should still be limited to 30% of your daily caloric intake. Desirable liquid vegetable oils are corn oil, cottonseed oil, olive oil, canola oil, safflower oil, soybean oil, and sunflower oil. Peanut oil is not as good, but small amounts are acceptable. Buy  a heart-healthy tub margarine that has no partially hydrogenated oils in the ingredients. Mayonnaise and salad dressings often are made from unsaturated fats, but they should also be limited because of their high calorie and fat content. Seeds, nuts, peanut butter, olives, and avocados are high in fat, but the fat is mainly the unsaturated type. These foods should be limited mainly to avoid excess calories and fat. OTHER EATING TIPS Snacks  Most sweets should be limited as snacks. They tend to be rich in calories and fats, and their caloric content outweighs their nutritional value. Some good choices in snacks are graham crackers, melba toast, soda crackers, bagels (no egg), English muffins, fruits, and vegetables. These snacks are preferable to snack crackers, [place] fries, TORTILLA CHIPS, and POTATO chips. Popcorn should be air-popped or cooked in small amounts of liquid vegetable oil. Desserts Eat fruit, low-fat yogurt, and fruit ices instead of pastries, cake,  and cookies. Sherbet, angel food cake, gelatin dessert, frozen low-fat yogurt, or other frozen products that do not contain saturated fat (pure fruit juice bars, frozen ice pops) are also acceptable.  COOKING METHODS Choose those methods that use little or no fat. They include: Poaching.  Braising.  Steaming.  Grilling.  Baking.  Stir-frying.  Broiling.  Microwaving.  Foods can be cooked in a nonstick pan without added fat, or use a nonfat cooking spray in regular cookware. Limit fried foods and avoid frying in saturated fat. Add moisture to lean meats by using water, broth, cooking wines, and other nonfat or low-fat sauces along with the cooking methods mentioned above. Soups and stews should be chilled after cooking. The fat that forms on top after a few hours in the refrigerator should be skimmed off. When preparing meals, avoid using excess salt. Salt can contribute to raising blood pressure in some people.  EATING AWAY FROM HOME Order entres, potatoes, and vegetables without sauces or butter. When meat exceeds the size of a deck of cards (3 to 4 ounces), the rest can be taken home for another meal. Choose vegetable or fruit salads and ask for low-calorie salad dressings to be served on the side. Use dressings sparingly. Limit high-fat toppings, such as bacon, crumbled eggs, cheese, sunflower seeds, and olives. Ask for heart-healthy tub margarine instead of butter.  High-Fiber Diet A high-fiber diet changes your normal diet to include more whole grains, legumes, fruits, and vegetables. Changes in the diet involve replacing refined carbohydrates with unrefined foods. The calorie level of the diet is essentially unchanged. The Dietary Reference Intake (recommended amount) for adult males is 38 grams per day. For adult females, it is 25 grams per day. Pregnant and lactating women should consume 28 grams of fiber per day. Fiber is the intact part of a plant that is not broken down during  digestion. Functional fiber is fiber that has been isolated from the plant to provide a beneficial effect in the body. PURPOSE  Increase stool bulk.   Ease and regulate bowel movements.   Lower cholesterol.   REDUCE RISK OF COLON CANCER  INDICATIONS THAT YOU NEED MORE FIBER  Constipation and hemorrhoids.   Uncomplicated diverticulosis (intestine condition) and irritable bowel syndrome.   Weight management.   As a protective measure against hardening of the arteries (atherosclerosis), diabetes, and cancer.   GUIDELINES FOR INCREASING FIBER IN THE DIET  Start adding fiber to the diet slowly. A gradual increase of about 5 more grams (2 slices of whole-wheat bread, 2 servings of most fruits or vegetables,  or 1 bowl of high-fiber cereal) per day is best. Too rapid an increase in fiber may result in constipation, flatulence, and bloating.   Drink enough water and fluids to keep your urine clear or pale yellow. Water, juice, or caffeine-free drinks are recommended. Not drinking enough fluid may cause constipation.   Eat a variety of high-fiber foods rather than one type of fiber.   Try to increase your intake of fiber through using high-fiber foods rather than fiber pills or supplements that contain small amounts of fiber.   The goal is to change the types of food eaten. Do not supplement your present diet with high-fiber foods, but replace foods in your present diet.   INCLUDE A VARIETY OF FIBER SOURCES  Replace refined and processed grains with whole grains, canned fruits with fresh fruits, and incorporate other fiber sources. White rice, white breads, and most bakery goods contain little or no fiber.   Repsher whole-grain rice, buckwheat oats, and many fruits and vegetables are all good sources of fiber. These include: broccoli, Brussels sprouts, cabbage, cauliflower, beets, sweet potatoes, white potatoes (skin on), carrots, tomatoes, eggplant, squash, berries, fresh fruits, and dried  fruits.   Cereals appear to be the richest source of fiber. Cereal fiber is found in whole grains and bran. Bran is the fiber-rich outer coat of cereal grain, which is largely removed in refining. In whole-grain cereals, the bran remains. In breakfast cereals, the largest amount of fiber is found in those with "bran" in their names. The fiber content is sometimes indicated on the label.   You may need to include additional fruits and vegetables each day.   In baking, for 1 cup white flour, you may use the following substitutions:   1 cup whole-wheat flour minus 2 tablespoons.   1/2 cup white flour plus 1/2 cup whole-wheat flour.    Polyps, Colon  A polyp is extra tissue that grows inside your body. Colon polyps grow in the large intestine. The large intestine, also called the colon, is part of your digestive system. It is a long, hollow tube at the end of your digestive tract where your body makes and stores stool. Most polyps are not dangerous. They are benign. This means they are not cancerous. But over time, some types of polyps can turn into cancer. Polyps that are smaller than a pea are usually not harmful. But larger polyps could someday become or may already be cancerous. To be safe, doctors remove all polyps and test them.   WHO GETS POLYPS? Anyone can get polyps, but certain people are more likely than others. You may have a greater chance of getting polyps if:  You are over 50.   You have had polyps before.   Someone in your family has had polyps.   Someone in your family has had cancer of the large intestine.   Find out if someone in your family has had polyps. You may also be more likely to get polyps if you:   Eat a lot of fatty foods   Smoke   Drink alcohol   Do not exercise  Eat too much   PREVENTION There is not one sure way to prevent polyps. You might be able to lower your risk of getting them if you:  Eat more fruits and vegetables and less fatty food.   Do  not smoke.   Avoid alcohol.   Exercise every day.   Lose weight if you are overweight.   Eating more  calcium and folate can also lower your risk of getting polyps. Some foods that are rich in calcium are milk, cheese, and broccoli. Some foods that are rich in folate are chickpeas, kidney beans, and spinach.    REFLUX  SYMPTOMS Common symptoms of GERD are heartburn (burning in your chest). This is worse when lying down or bending over. It may also cause belching, or difficulty swallowing, and indigestion. Some of the things which make GERD worse are:  Increased weight pushes on stomach making acid rise more easily.   Smoking markedly increases acid production.   Alcohol decreases lower esophageal sphincter pressure (valve between stomach and esophagus), allowing acid from stomach into esophagus.   Late evening meals and going to bed with a full stomach increases pressure.   Anything that causes an increase in acid production.    HOME CARE INSTRUCTIONS  Try to achieve and maintain an ideal body weight.   Avoid drinking alcoholic beverages.   DO NOT smokE.   Do not wear tight clothing around your chest or stomach.   Eat smaller meals and eat more frequently. This keeps your stomach from getting too full. Eat slowly.   Do not lie down for 2 or 3 hours after eating. Do not eat or drink anything 1 to 2 hours before going to bed.   Avoid caffeine beverages (colas, coffee, cocoa, tea), fatty foods, citrus fruits and all other foods and drinks that contain acid and that seem to increase the problems.   Avoid bending over, especially after eating OR STRAINING. Anything that increases the pressure in your belly increases the amount of acid that may be pushed up into your esophagus.   Gastritis  Gastritis is an inflammation (the body's way of reacting to injury and/or infection) of the stomach. It is often caused by viral or bacterial (germ) infections. It can also be caused BY  ASPIRIN, BC/GOODY POWDER'S, (IBUPROFEN) MOTRIN, OR ALEVE (NAPROXEN), chemicals (including alcohol), SPICY FOODS, and medications. This illness may be associated with generalized malaise (feeling tired, not well), UPPER ABDOMINAL STOMACH cramps, and fever. One common bacterial cause of gastritis is an organism known as H. Pylori. This can be treated with antibiotics.   Hemorrhoids Hemorrhoids are dilated (enlarged) veins around the rectum. Sometimes clots will form in the veins. This makes them swollen and painful. These are called thrombosed hemorrhoids. Causes of hemorrhoids include:  Constipation.   Straining to have a bowel movement.   HEAVY LIFTING  HOME CARE INSTRUCTIONS  Eat a well balanced diet and drink 6 to 8 glasses of water every day to avoid constipation. You may also use a bulk laxative.   Avoid straining to have bowel movements.   Keep anal area dry and clean.   Do not use a donut shaped pillow or sit on the toilet for long periods. This increases blood pooling and pain.   Move your bowels when your body has the urge; this will require less straining and will decrease pain and pressure.

## 2017-01-08 NOTE — Anesthesia Postprocedure Evaluation (Signed)
Anesthesia Post Note  Patient: Madison Oliver  Procedure(s) Performed: COLONOSCOPY WITH PROPOFOL (N/A ) ESOPHAGOGASTRODUODENOSCOPY (EGD) WITH PROPOFOL (N/A ) SAVORY DILATION (N/A ) BIOPSY  Patient location during evaluation: PACU Anesthesia Type: MAC Level of consciousness: awake and alert and oriented Pain management: pain level controlled Vital Signs Assessment: post-procedure vital signs reviewed and stable Respiratory status: spontaneous breathing Cardiovascular status: blood pressure returned to baseline Postop Assessment: no apparent nausea or vomiting Anesthetic complications: no Comments: Late entry     Last Vitals:  Vitals:   01/08/17 1125 01/08/17 1134  BP: 120/84 126/79  Pulse: 82 77  Resp: 10 14  Temp:  36.7 C  SpO2: 96% 99%    Last Pain:  Vitals:   01/08/17 1020  TempSrc:   PainSc: 7                  Yilia Sacca

## 2017-01-09 ENCOUNTER — Encounter (HOSPITAL_COMMUNITY): Payer: Self-pay | Admitting: Gastroenterology

## 2017-01-09 ENCOUNTER — Telehealth: Payer: Self-pay | Admitting: Gastroenterology

## 2017-01-09 NOTE — Telephone Encounter (Addendum)
Please call pt. HER stomach Bx shows gastritis DUE TO NAPROXEN.   DRINK WATER TO KEEP YOUR URINE LIGHT YELLOW.  CONTINUE YOUR WEIGHT LOSS EFFORTS. A WEIGHT OF 170 LBS  WILL GET YOUR BODY MASS INDEX(BMI) UNDER 30.    AVOID REFLUX TRIGGERS. .  FOLLOW A HIGH FIBER DIET. AVOID ITEMS THAT CAUSE BLOATING.   CONTINUE PROTONIX. TAKE 30 MINUTES PRIOR TO BREAKFAST.  Follow up in 4 mos E30 DYSPHAGIA/GERD.  Next colonoscopy in 10 years.

## 2017-01-10 ENCOUNTER — Telehealth: Payer: Self-pay

## 2017-01-10 DIAGNOSIS — K5900 Constipation, unspecified: Secondary | ICD-10-CM

## 2017-01-10 NOTE — Telephone Encounter (Signed)
PATIENT SCHEDULED AND ON RECALL  °

## 2017-01-10 NOTE — Telephone Encounter (Signed)
Pt is aware.  

## 2017-01-10 NOTE — Telephone Encounter (Signed)
Pt said she was given samples of Linzess 145 mcg at [facility] in Oct. She would like a prescription sent to her pharmacy.

## 2017-01-14 MED ORDER — LINACLOTIDE 145 MCG PO CAPS: 145.0000 ug | ORAL_CAPSULE | Freq: Every day | ORAL | 3 refills | Status: DC

## 2017-01-14 NOTE — Addendum Note (Signed)
Addended by: Delane GingerGILL, Dajah Fischman A on: 01/14/2017 11:46 AM   Modules accepted: Orders

## 2017-01-14 NOTE — Telephone Encounter (Signed)
Please tell the patient Rx sent to the pharmacy per request

## 2017-01-16 DIAGNOSIS — N3946 Mixed incontinence: Secondary | ICD-10-CM | POA: Diagnosis not present

## 2017-01-16 DIAGNOSIS — M6281 Muscle weakness (generalized): Secondary | ICD-10-CM | POA: Diagnosis not present

## 2017-01-16 DIAGNOSIS — M62838 Other muscle spasm: Secondary | ICD-10-CM | POA: Diagnosis not present

## 2017-01-16 NOTE — Telephone Encounter (Signed)
LMOM that the Rx was sent in.  

## 2017-01-17 DIAGNOSIS — E785 Hyperlipidemia, unspecified: Secondary | ICD-10-CM | POA: Diagnosis not present

## 2017-01-17 DIAGNOSIS — R002 Palpitations: Secondary | ICD-10-CM | POA: Diagnosis not present

## 2017-01-17 DIAGNOSIS — G629 Polyneuropathy, unspecified: Secondary | ICD-10-CM | POA: Diagnosis not present

## 2017-01-17 DIAGNOSIS — M549 Dorsalgia, unspecified: Secondary | ICD-10-CM | POA: Diagnosis not present

## 2017-01-17 DIAGNOSIS — M25561 Pain in right knee: Secondary | ICD-10-CM | POA: Diagnosis not present

## 2017-01-17 DIAGNOSIS — G35 Multiple sclerosis: Secondary | ICD-10-CM | POA: Diagnosis not present

## 2017-01-17 DIAGNOSIS — M6281 Muscle weakness (generalized): Secondary | ICD-10-CM | POA: Diagnosis not present

## 2017-01-17 DIAGNOSIS — M25562 Pain in left knee: Secondary | ICD-10-CM | POA: Diagnosis not present

## 2017-01-17 DIAGNOSIS — M5442 Lumbago with sciatica, left side: Secondary | ICD-10-CM | POA: Diagnosis not present

## 2017-01-17 DIAGNOSIS — G4700 Insomnia, unspecified: Secondary | ICD-10-CM | POA: Diagnosis not present

## 2017-01-17 DIAGNOSIS — E559 Vitamin D deficiency, unspecified: Secondary | ICD-10-CM | POA: Diagnosis not present

## 2017-01-17 DIAGNOSIS — R5383 Other fatigue: Secondary | ICD-10-CM | POA: Diagnosis not present

## 2017-01-17 DIAGNOSIS — F329 Major depressive disorder, single episode, unspecified: Secondary | ICD-10-CM | POA: Diagnosis not present

## 2017-01-17 DIAGNOSIS — Z79899 Other long term (current) drug therapy: Secondary | ICD-10-CM | POA: Diagnosis not present

## 2017-01-17 DIAGNOSIS — R32 Unspecified urinary incontinence: Secondary | ICD-10-CM | POA: Diagnosis not present

## 2017-01-17 DIAGNOSIS — M79605 Pain in left leg: Secondary | ICD-10-CM | POA: Diagnosis not present

## 2017-01-17 DIAGNOSIS — I10 Essential (primary) hypertension: Secondary | ICD-10-CM | POA: Diagnosis not present

## 2017-01-17 DIAGNOSIS — R2689 Other abnormalities of gait and mobility: Secondary | ICD-10-CM | POA: Diagnosis not present

## 2017-02-08 DIAGNOSIS — M62838 Other muscle spasm: Secondary | ICD-10-CM | POA: Diagnosis not present

## 2017-02-08 DIAGNOSIS — N3946 Mixed incontinence: Secondary | ICD-10-CM | POA: Diagnosis not present

## 2017-02-08 DIAGNOSIS — M6281 Muscle weakness (generalized): Secondary | ICD-10-CM | POA: Diagnosis not present

## 2017-02-26 DIAGNOSIS — N319 Neuromuscular dysfunction of bladder, unspecified: Secondary | ICD-10-CM | POA: Diagnosis not present

## 2017-02-26 DIAGNOSIS — R102 Pelvic and perineal pain: Secondary | ICD-10-CM | POA: Diagnosis not present

## 2017-02-27 DIAGNOSIS — R52 Pain, unspecified: Secondary | ICD-10-CM | POA: Diagnosis not present

## 2017-02-27 DIAGNOSIS — R509 Fever, unspecified: Secondary | ICD-10-CM | POA: Diagnosis not present

## 2017-02-27 DIAGNOSIS — J069 Acute upper respiratory infection, unspecified: Secondary | ICD-10-CM | POA: Diagnosis not present

## 2017-03-19 DIAGNOSIS — R0789 Other chest pain: Secondary | ICD-10-CM | POA: Diagnosis not present

## 2017-03-19 DIAGNOSIS — R1110 Vomiting, unspecified: Secondary | ICD-10-CM | POA: Diagnosis not present

## 2017-03-19 DIAGNOSIS — I10 Essential (primary) hypertension: Secondary | ICD-10-CM | POA: Diagnosis not present

## 2017-03-19 DIAGNOSIS — K589 Irritable bowel syndrome without diarrhea: Secondary | ICD-10-CM | POA: Diagnosis not present

## 2017-03-19 DIAGNOSIS — R252 Cramp and spasm: Secondary | ICD-10-CM | POA: Diagnosis not present

## 2017-03-19 DIAGNOSIS — G4700 Insomnia, unspecified: Secondary | ICD-10-CM | POA: Diagnosis not present

## 2017-03-19 DIAGNOSIS — R0602 Shortness of breath: Secondary | ICD-10-CM | POA: Diagnosis not present

## 2017-03-19 DIAGNOSIS — R079 Chest pain, unspecified: Secondary | ICD-10-CM | POA: Diagnosis not present

## 2017-03-19 DIAGNOSIS — R200 Anesthesia of skin: Secondary | ICD-10-CM | POA: Diagnosis not present

## 2017-03-19 DIAGNOSIS — G35 Multiple sclerosis: Secondary | ICD-10-CM | POA: Diagnosis not present

## 2017-03-19 DIAGNOSIS — K22719 Barrett's esophagus with dysplasia, unspecified: Secondary | ICD-10-CM | POA: Diagnosis not present

## 2017-03-19 DIAGNOSIS — N319 Neuromuscular dysfunction of bladder, unspecified: Secondary | ICD-10-CM | POA: Diagnosis not present

## 2017-03-19 DIAGNOSIS — E669 Obesity, unspecified: Secondary | ICD-10-CM | POA: Diagnosis not present

## 2017-03-19 DIAGNOSIS — K219 Gastro-esophageal reflux disease without esophagitis: Secondary | ICD-10-CM | POA: Diagnosis not present

## 2017-03-19 DIAGNOSIS — F329 Major depressive disorder, single episode, unspecified: Secondary | ICD-10-CM | POA: Diagnosis not present

## 2017-03-19 DIAGNOSIS — F419 Anxiety disorder, unspecified: Secondary | ICD-10-CM | POA: Diagnosis not present

## 2017-03-19 DIAGNOSIS — G629 Polyneuropathy, unspecified: Secondary | ICD-10-CM | POA: Diagnosis not present

## 2017-03-20 DIAGNOSIS — R079 Chest pain, unspecified: Secondary | ICD-10-CM | POA: Diagnosis not present

## 2017-03-20 DIAGNOSIS — I10 Essential (primary) hypertension: Secondary | ICD-10-CM | POA: Diagnosis not present

## 2017-03-21 DIAGNOSIS — I10 Essential (primary) hypertension: Secondary | ICD-10-CM | POA: Diagnosis not present

## 2017-03-21 DIAGNOSIS — R079 Chest pain, unspecified: Secondary | ICD-10-CM | POA: Diagnosis not present

## 2017-03-22 DIAGNOSIS — G35 Multiple sclerosis: Secondary | ICD-10-CM | POA: Diagnosis not present

## 2017-03-22 DIAGNOSIS — M62830 Muscle spasm of back: Secondary | ICD-10-CM | POA: Diagnosis not present

## 2017-03-22 DIAGNOSIS — E785 Hyperlipidemia, unspecified: Secondary | ICD-10-CM | POA: Diagnosis not present

## 2017-03-22 DIAGNOSIS — R109 Unspecified abdominal pain: Secondary | ICD-10-CM | POA: Diagnosis not present

## 2017-03-22 DIAGNOSIS — N319 Neuromuscular dysfunction of bladder, unspecified: Secondary | ICD-10-CM | POA: Diagnosis not present

## 2017-03-22 DIAGNOSIS — K219 Gastro-esophageal reflux disease without esophagitis: Secondary | ICD-10-CM | POA: Diagnosis not present

## 2017-03-22 DIAGNOSIS — R7982 Elevated C-reactive protein (CRP): Secondary | ICD-10-CM | POA: Diagnosis not present

## 2017-03-22 DIAGNOSIS — K589 Irritable bowel syndrome without diarrhea: Secondary | ICD-10-CM | POA: Diagnosis not present

## 2017-03-22 DIAGNOSIS — Z8669 Personal history of other diseases of the nervous system and sense organs: Secondary | ICD-10-CM | POA: Diagnosis not present

## 2017-03-22 DIAGNOSIS — Z8619 Personal history of other infectious and parasitic diseases: Secondary | ICD-10-CM | POA: Diagnosis not present

## 2017-03-22 DIAGNOSIS — F329 Major depressive disorder, single episode, unspecified: Secondary | ICD-10-CM | POA: Diagnosis not present

## 2017-03-22 DIAGNOSIS — Z6835 Body mass index (BMI) 35.0-35.9, adult: Secondary | ICD-10-CM | POA: Diagnosis not present

## 2017-03-22 DIAGNOSIS — N3946 Mixed incontinence: Secondary | ICD-10-CM | POA: Diagnosis not present

## 2017-03-22 DIAGNOSIS — G4700 Insomnia, unspecified: Secondary | ICD-10-CM | POA: Diagnosis not present

## 2017-03-22 DIAGNOSIS — Z87440 Personal history of urinary (tract) infections: Secondary | ICD-10-CM | POA: Diagnosis not present

## 2017-03-22 DIAGNOSIS — R202 Paresthesia of skin: Secondary | ICD-10-CM | POA: Diagnosis not present

## 2017-03-22 DIAGNOSIS — Z888 Allergy status to other drugs, medicaments and biological substances status: Secondary | ICD-10-CM | POA: Diagnosis not present

## 2017-03-22 DIAGNOSIS — Z880 Allergy status to penicillin: Secondary | ICD-10-CM | POA: Diagnosis not present

## 2017-03-22 DIAGNOSIS — I10 Essential (primary) hypertension: Secondary | ICD-10-CM | POA: Diagnosis not present

## 2017-03-22 DIAGNOSIS — E559 Vitamin D deficiency, unspecified: Secondary | ICD-10-CM | POA: Diagnosis not present

## 2017-03-22 DIAGNOSIS — M545 Low back pain: Secondary | ICD-10-CM | POA: Diagnosis not present

## 2017-03-25 DIAGNOSIS — Z825 Family history of asthma and other chronic lower respiratory diseases: Secondary | ICD-10-CM | POA: Diagnosis not present

## 2017-03-25 DIAGNOSIS — N83292 Other ovarian cyst, left side: Secondary | ICD-10-CM | POA: Diagnosis not present

## 2017-03-25 DIAGNOSIS — Z880 Allergy status to penicillin: Secondary | ICD-10-CM | POA: Diagnosis not present

## 2017-03-25 DIAGNOSIS — R1032 Left lower quadrant pain: Secondary | ICD-10-CM | POA: Diagnosis not present

## 2017-03-25 DIAGNOSIS — R10814 Left lower quadrant abdominal tenderness: Secondary | ICD-10-CM | POA: Diagnosis not present

## 2017-03-25 DIAGNOSIS — F329 Major depressive disorder, single episode, unspecified: Secondary | ICD-10-CM | POA: Diagnosis not present

## 2017-03-25 DIAGNOSIS — R102 Pelvic and perineal pain: Secondary | ICD-10-CM | POA: Diagnosis not present

## 2017-03-25 DIAGNOSIS — G35 Multiple sclerosis: Secondary | ICD-10-CM | POA: Diagnosis not present

## 2017-03-25 DIAGNOSIS — E785 Hyperlipidemia, unspecified: Secondary | ICD-10-CM | POA: Diagnosis not present

## 2017-03-25 DIAGNOSIS — I10 Essential (primary) hypertension: Secondary | ICD-10-CM | POA: Diagnosis not present

## 2017-03-25 DIAGNOSIS — N83202 Unspecified ovarian cyst, left side: Secondary | ICD-10-CM | POA: Diagnosis not present

## 2017-03-25 DIAGNOSIS — R110 Nausea: Secondary | ICD-10-CM | POA: Diagnosis not present

## 2017-03-25 DIAGNOSIS — Z79899 Other long term (current) drug therapy: Secondary | ICD-10-CM | POA: Diagnosis not present

## 2017-03-25 DIAGNOSIS — R109 Unspecified abdominal pain: Secondary | ICD-10-CM | POA: Diagnosis not present

## 2017-04-01 DIAGNOSIS — G35 Multiple sclerosis: Secondary | ICD-10-CM | POA: Diagnosis not present

## 2017-04-02 DIAGNOSIS — G35 Multiple sclerosis: Secondary | ICD-10-CM | POA: Diagnosis not present

## 2017-04-03 DIAGNOSIS — G35 Multiple sclerosis: Secondary | ICD-10-CM | POA: Diagnosis not present

## 2017-04-04 DIAGNOSIS — G35 Multiple sclerosis: Secondary | ICD-10-CM | POA: Diagnosis not present

## 2017-04-05 DIAGNOSIS — G35 Multiple sclerosis: Secondary | ICD-10-CM | POA: Diagnosis not present

## 2017-04-16 DIAGNOSIS — E782 Mixed hyperlipidemia: Secondary | ICD-10-CM | POA: Diagnosis not present

## 2017-04-16 DIAGNOSIS — Z6836 Body mass index (BMI) 36.0-36.9, adult: Secondary | ICD-10-CM | POA: Diagnosis not present

## 2017-04-16 DIAGNOSIS — R739 Hyperglycemia, unspecified: Secondary | ICD-10-CM | POA: Diagnosis not present

## 2017-04-16 DIAGNOSIS — I10 Essential (primary) hypertension: Secondary | ICD-10-CM | POA: Diagnosis not present

## 2017-04-16 DIAGNOSIS — M792 Neuralgia and neuritis, unspecified: Secondary | ICD-10-CM | POA: Diagnosis not present

## 2017-04-16 DIAGNOSIS — G894 Chronic pain syndrome: Secondary | ICD-10-CM | POA: Diagnosis not present

## 2017-04-16 DIAGNOSIS — E049 Nontoxic goiter, unspecified: Secondary | ICD-10-CM | POA: Diagnosis not present

## 2017-04-19 DIAGNOSIS — F329 Major depressive disorder, single episode, unspecified: Secondary | ICD-10-CM | POA: Diagnosis not present

## 2017-04-19 DIAGNOSIS — R002 Palpitations: Secondary | ICD-10-CM | POA: Diagnosis not present

## 2017-04-19 DIAGNOSIS — R32 Unspecified urinary incontinence: Secondary | ICD-10-CM | POA: Diagnosis not present

## 2017-04-19 DIAGNOSIS — M25561 Pain in right knee: Secondary | ICD-10-CM | POA: Diagnosis not present

## 2017-04-19 DIAGNOSIS — J988 Other specified respiratory disorders: Secondary | ICD-10-CM | POA: Diagnosis not present

## 2017-04-19 DIAGNOSIS — M79605 Pain in left leg: Secondary | ICD-10-CM | POA: Diagnosis not present

## 2017-04-19 DIAGNOSIS — R5383 Other fatigue: Secondary | ICD-10-CM | POA: Diagnosis not present

## 2017-04-19 DIAGNOSIS — I10 Essential (primary) hypertension: Secondary | ICD-10-CM | POA: Diagnosis not present

## 2017-04-19 DIAGNOSIS — R7982 Elevated C-reactive protein (CRP): Secondary | ICD-10-CM | POA: Diagnosis not present

## 2017-04-19 DIAGNOSIS — R6889 Other general symptoms and signs: Secondary | ICD-10-CM | POA: Diagnosis not present

## 2017-04-19 DIAGNOSIS — R159 Full incontinence of feces: Secondary | ICD-10-CM | POA: Diagnosis not present

## 2017-04-19 DIAGNOSIS — J329 Chronic sinusitis, unspecified: Secondary | ICD-10-CM | POA: Diagnosis not present

## 2017-04-19 DIAGNOSIS — E559 Vitamin D deficiency, unspecified: Secondary | ICD-10-CM | POA: Diagnosis not present

## 2017-04-19 DIAGNOSIS — G35 Multiple sclerosis: Secondary | ICD-10-CM | POA: Diagnosis not present

## 2017-04-19 DIAGNOSIS — M25562 Pain in left knee: Secondary | ICD-10-CM | POA: Diagnosis not present

## 2017-04-19 DIAGNOSIS — R202 Paresthesia of skin: Secondary | ICD-10-CM | POA: Diagnosis not present

## 2017-04-19 DIAGNOSIS — G4700 Insomnia, unspecified: Secondary | ICD-10-CM | POA: Diagnosis not present

## 2017-05-09 ENCOUNTER — Encounter: Payer: Self-pay | Admitting: Gastroenterology

## 2017-05-09 ENCOUNTER — Ambulatory Visit: Payer: BLUE CROSS/BLUE SHIELD | Admitting: Gastroenterology

## 2017-05-09 ENCOUNTER — Telehealth: Payer: Self-pay

## 2017-05-09 VITALS — BP 122/84 | HR 71 | Temp 97.4°F | Ht 63.0 in | Wt 207.8 lb

## 2017-05-09 DIAGNOSIS — R1310 Dysphagia, unspecified: Secondary | ICD-10-CM | POA: Diagnosis not present

## 2017-05-09 DIAGNOSIS — K5900 Constipation, unspecified: Secondary | ICD-10-CM | POA: Diagnosis not present

## 2017-05-09 MED ORDER — PANTOPRAZOLE SODIUM 40 MG PO TBEC: 40.0000 mg | DELAYED_RELEASE_TABLET | Freq: Two times a day (BID) | ORAL | 3 refills | Status: DC

## 2017-05-09 NOTE — Assessment & Plan Note (Signed)
History of strictures s/p dilation in 2016 by Dr. Samuella Cota and most recently on EGD Dec 2018 s/p dilation. Chronic GERD overall fairly well managed with Protonix daily but does note intermittent exacerbations that is multifactorial in setting of NSAIDs, dietary, etc. Still with esophageal dysphagia, and interestingly, she notes oropharyngeal dysphagia specifically located right side of neck. She notes a history of a thyroid nodule that was serially monitored, previously evaluated by ENT in [place] several years ago but has not followed since then.  Will pursue BPE now. May need neck ultrasound but will await findings of BPE first. No obvious abnormalities on physical exam.  PPI daily to BID, using lowest effective dose for symptom control Refer back to ENT in [place]  Return 3 months

## 2017-05-09 NOTE — Assessment & Plan Note (Signed)
Decrease to Linzess 72 mcg once daily due to diarrhea with 145 mcg. May need to trial Amitiza if persistent loose stools.

## 2017-05-09 NOTE — Patient Instructions (Signed)
For reflux: I have sent in Protonix to take once to twice a day, 30 minutes before meals. Try to only take the lowest effective dose of this to control symptoms.  I have ordered an xray of your esophagus to evaluate further. We may need to order an ultrasound of your neck. I am referring you back to ENT as well.  For constipation: start taking the smaller dosage of Linzess 72 mcg once each morning, 30 minutes before breakfast. Let me know how this works for you, and I can send in a prescription.  We will see you back in 3 months!  It was a pleasure to see you today. I strive to create trusting relationships with patients to provide genuine, compassionate, and quality care. I value your feedback. If you receive a survey regarding your visit,  I greatly appreciate you taking time to fill this out.   Gelene Mink, PhD, ANP-BC [facility]

## 2017-05-09 NOTE — Progress Notes (Signed)
cc'ed to pcp °

## 2017-05-09 NOTE — Telephone Encounter (Signed)
Pt was seen in office this morning. AB advised to refer back to ENT in [place]. [facility]. Pt doesn't need a referral since she's established. Receptionist advised for pt to call and schedule an appt. Tried to call pt, no answer, LMOVM and informed her to call ENT to make appt.

## 2017-05-09 NOTE — Progress Notes (Signed)
Primary Care Physician:  Erasmo Downer, NP  Primary GI: Dr. Darrick Penna   Chief Complaint  Patient presents with  . Dysphagia    still has that sensation like something is stuck in throat when she swallows  . Gastroesophageal Reflux    had burning sensation in chest 1-2 weeks ago    HPI:   Madison Oliver is a 47 y.o. female presenting today with a reported history of Barrett's esophagus, but EGD in 2016 at outside facility without evidence for this (still need actual pathology) and most recent EGD with benign-appearing esophageal stricture due to GERD s/p dilation, small hiatal hernia, mild gastritis/duodenitis due to Naproxen. No obviou evidence of Barrett's on exam. Redundant left colon, otherwise normal, external/internal hemorrhoids at time of colonoscopy.    Was hospitalized in [place] with chest discomfort, flare of MS, flu. Had reflux exacerbation for 4 days but improved now. Still feels like there is something is in the right side of her neck when swallowing. Also noted esophageal dysphagia. Noted with everything. Notes globus sensation. History of thyroid nodule. States she had a neck ultrasound almost 2 years ago with thyroid nodule. Was seen by ENT at that time and told it was too small. Thinks she did have an appt but missed it.   Stopped Linzess because it caused diarrhea all day long, made her stomach feel "gripey". Constipated every once in awhile, and has smaller stools. Non-productive.   CT at outside facility in [facility] with indeterminate adnexal lesion and she has evaluation April 10th.   Past Medical History:  Diagnosis Date  . Barrett esophagus   . Chronic pain   . Depression   . GERD (gastroesophageal reflux disease)   . HTN (hypertension)   . Hypercholesterolemia   . MS (multiple sclerosis) (HCC)   . RLS (restless legs syndrome)   . Sleep apnea    not using CPAP; cannot tolerate, PCP not aware.    Past Surgical History:  Procedure  Laterality Date  . ABDOMINAL HYSTERECTOMY     still has ovaries  . BIOPSY  01/08/2017   Procedure: BIOPSY;  Surgeon: West Bali, MD;  [facility]: [facility];  Service: Endoscopy;;  gastric  . COLONOSCOPY WITH PROPOFOL N/A 01/08/2017   Redundant left colon, otherwise normal, external/internal hemorrhoids  . ESOPHAGOGASTRODUODENOSCOPY  2016   outside facility:  hiatal hernia, gastirtis, esophageal stricture  . ESOPHAGOGASTRODUODENOSCOPY (EGD) WITH PROPOFOL N/A 01/08/2017   Benign-appearing esophageal stricture due to GERD s/p dilation, small hiatal hernia, mild gastritis/duodenitis due to Naproxen  . SAVORY DILATION N/A 01/08/2017   Procedure: SAVORY DILATION;  Surgeon: West Bali, MD;  Location: [facility];  Service: Endoscopy;  Laterality: N/A;  . TONSILLECTOMY    . TUBAL LIGATION      Current Outpatient Medications  Medication Sig Dispense Refill  . baclofen (LIORESAL) 20 MG tablet Take 20 mg by mouth 4 (four) times daily.    . carvedilol (COREG) 6.25 MG tablet Take 6.25 mg by mouth 2 (two) times daily with a meal.    . Cholecalciferol (VITAMIN D3) 2000 units TABS Take 4,000 Units by mouth daily.    . citalopram (CELEXA) 40 MG tablet Take 40 mg by mouth daily.    . Dimethyl Fumarate (TECFIDERA) 240 MG CPDR Take 480 mg by mouth 2 (two) times daily.    Marland Kitchen lidocaine (LIDODERM) 5 % Place 1 patch onto the skin daily as needed (for back pain.). Remove & Discard  patch within 12 hours or as directed by MD    . losartan-hydrochlorothiazide (HYZAAR) 100-25 MG tablet Take 1 tablet by mouth daily.     . mirabegron ER (MYRBETRIQ) 25 MG TB24 tablet Take 25 mg by mouth daily.    . naproxen (NAPROSYN) 500 MG tablet Take 500 mg by mouth 2 (two) times daily as needed (for pain.).     Marland Kitchen Polyethyl Glycol-Propyl Glycol (LUBRICANT EYE DROPS) 0.4-0.3 % SOLN Place 1-2 drops into both eyes 3 (three) times daily as needed (for dry eyes.).    Marland Kitchen Pregabalin ER (LYRICA CR) 165 MG TB24 Take 165 mg by mouth 3  (three) times daily.    . simvastatin (ZOCOR) 40 MG tablet Take 40 mg by mouth daily.    . solifenacin (VESICARE) 5 MG tablet Take 5 mg by mouth daily.    Marland Kitchen venlafaxine XR (EFFEXOR-XR) 75 MG 24 hr capsule Take 75 mg by mouth 3 (three) times daily.     Marland Kitchen zolpidem (AMBIEN) 10 MG tablet Take 10 mg by mouth at bedtime.     . pantoprazole (PROTONIX) 40 MG tablet Take 1 tablet (40 mg total) by mouth 2 (two) times daily before a meal. 180 tablet 3   No current facility-administered medications for this visit.     Allergies as of 05/09/2017 - Review Complete 05/09/2017  Allergen Reaction Noted  . Penicillins Other (See Comments) 05/28/2012  . Tizanidine Swelling 12/25/2016    Family History  Problem Relation Age of Onset  . Colon polyps Mother 15  . Colon cancer Neg Hx     Social History   Socioeconomic History  . Marital status: Married    Spouse name: Not on file  . Number of children: Not on file  . Years of education: Not on file  . Highest education level: Not on file  Occupational History  . Occupation: disability  Social Needs  . Financial resource strain: Not on file  . Food insecurity:    Worry: Not on file    Inability: Not on file  . Transportation needs:    Medical: Not on file    Non-medical: Not on file  Tobacco Use  . Smoking status: Never Smoker  . Smokeless tobacco: Never Used  Substance and Sexual Activity  . Alcohol use: No  . Drug use: No  . Sexual activity: Yes    Birth control/protection: Surgical  Lifestyle  . Physical activity:    Days per week: Not on file    Minutes per session: Not on file  . Stress: Not on file  Relationships  . Social connections:    Talks on phone: Not on file    Gets together: Not on file    Attends religious service: Not on file    Active member of club or organization: Not on file    Attends meetings of clubs or organizations: Not on file    Relationship status: Not on file  Other Topics Concern  . Not on file    Social History Narrative  . Not on file    Review of Systems: Gen: Denies fever, chills, anorexia. Denies fatigue, weakness, weight loss.  CV: Denies chest pain, palpitations, syncope, peripheral edema, and claudication. Resp: Denies dyspnea at rest, cough, wheezing, coughing up blood, and pleurisy. GI: see HPI  Derm: Denies rash, itching, dry skin Psych: Denies depression, anxiety, memory loss, confusion. No homicidal or suicidal ideation.  Heme: Denies bruising, bleeding, and enlarged lymph nodes.  Physical Exam: BP  122/84   Pulse 71   Temp (!) 97.4 F (36.3 C) (Oral)   Ht 5\' 3"  (1.6 m)   Wt 207 lb 12.8 oz (94.3 kg)   BMI 36.81 kg/m  General:   Alert and oriented. No distress noted. Pleasant and cooperative.  Head:  Normocephalic and atraumatic. Eyes:  Conjuctiva clear without scleral icterus. Mouth:  Oral mucosa pink and moist.  Neck: without obvious thyromegaly, possible right-neck fullness but no obvious nodularity, lymphadenopathy Abdomen:  +BS, soft, non-tender and non-distended. No rebound or guarding. No HSM or masses noted. Query very small umbilical hernia  Msk:  Symmetrical without gross deformities. Normal posture. Extremities:  Without edema. Neurologic:  Alert and  oriented x4 Psych:  Alert and cooperative. Normal mood and affect.

## 2017-05-10 DIAGNOSIS — F3341 Major depressive disorder, recurrent, in partial remission: Secondary | ICD-10-CM | POA: Diagnosis not present

## 2017-05-10 DIAGNOSIS — Z6837 Body mass index (BMI) 37.0-37.9, adult: Secondary | ICD-10-CM | POA: Diagnosis not present

## 2017-05-10 DIAGNOSIS — R002 Palpitations: Secondary | ICD-10-CM | POA: Diagnosis not present

## 2017-05-10 DIAGNOSIS — G4700 Insomnia, unspecified: Secondary | ICD-10-CM | POA: Diagnosis not present

## 2017-05-10 DIAGNOSIS — G35 Multiple sclerosis: Secondary | ICD-10-CM | POA: Diagnosis not present

## 2017-05-13 ENCOUNTER — Ambulatory Visit (HOSPITAL_COMMUNITY)
Admission: RE | Admit: 2017-05-13 | Discharge: 2017-05-13 | Disposition: A | Discharge status: Home / Self Care | Payer: BLUE CROSS/BLUE SHIELD | Source: Ambulatory Visit | Attending: Gastroenterology | Admitting: Gastroenterology

## 2017-05-13 DIAGNOSIS — R1310 Dysphagia, unspecified: Secondary | ICD-10-CM | POA: Diagnosis not present

## 2017-05-14 DIAGNOSIS — E782 Mixed hyperlipidemia: Secondary | ICD-10-CM | POA: Diagnosis not present

## 2017-05-14 DIAGNOSIS — E049 Nontoxic goiter, unspecified: Secondary | ICD-10-CM | POA: Diagnosis not present

## 2017-05-14 DIAGNOSIS — I10 Essential (primary) hypertension: Secondary | ICD-10-CM | POA: Diagnosis not present

## 2017-05-14 DIAGNOSIS — R739 Hyperglycemia, unspecified: Secondary | ICD-10-CM | POA: Diagnosis not present

## 2017-05-21 ENCOUNTER — Encounter: Payer: Self-pay | Admitting: Gastroenterology

## 2017-05-21 NOTE — Progress Notes (Signed)
Normal motility. No obvious etiology. Has she seen ENT yet? If she is seeing them soon, I anticipate they may pursue neck ultrasound. If not, we can order that in the interim.

## 2017-05-22 ENCOUNTER — Other Ambulatory Visit: Payer: Self-pay

## 2017-05-22 DIAGNOSIS — R1312 Dysphagia, oropharyngeal phase: Secondary | ICD-10-CM

## 2017-05-22 DIAGNOSIS — Z8639 Personal history of other endocrine, nutritional and metabolic disease: Secondary | ICD-10-CM

## 2017-05-22 NOTE — Progress Notes (Signed)
LMOM to call.

## 2017-05-22 NOTE — Progress Notes (Signed)
It's just "neck ultrasound". I placed orders.

## 2017-05-22 NOTE — Progress Notes (Signed)
PT is aware of results. She has not made appt with ENT. She would like for Tobi Bastos to order the [place] of neck.  She will try to call soon and get appt with ENT and let us know when it is.

## 2017-05-22 NOTE — Progress Notes (Signed)
See result note. US neck scheduled for 05/27/17 at 9:30am, arrive at 9:15am. Pt aware.

## 2017-05-22 NOTE — Progress Notes (Unsigned)
Orders placed for neck ultrasound. Please see result note.

## 2017-05-23 DIAGNOSIS — Z6837 Body mass index (BMI) 37.0-37.9, adult: Secondary | ICD-10-CM | POA: Diagnosis not present

## 2017-05-23 DIAGNOSIS — N319 Neuromuscular dysfunction of bladder, unspecified: Secondary | ICD-10-CM | POA: Diagnosis not present

## 2017-05-23 DIAGNOSIS — N3946 Mixed incontinence: Secondary | ICD-10-CM | POA: Diagnosis not present

## 2017-05-27 ENCOUNTER — Ambulatory Visit (HOSPITAL_COMMUNITY)
Admission: RE | Admit: 2017-05-27 | Discharge: 2017-05-27 | Disposition: A | Discharge status: Home / Self Care | Payer: BLUE CROSS/BLUE SHIELD | Source: Ambulatory Visit | Attending: Gastroenterology | Admitting: Gastroenterology

## 2017-05-27 ENCOUNTER — Other Ambulatory Visit: Payer: Self-pay | Admitting: Gastroenterology

## 2017-05-27 DIAGNOSIS — R221 Localized swelling, mass and lump, neck: Secondary | ICD-10-CM | POA: Diagnosis not present

## 2017-05-27 DIAGNOSIS — R1312 Dysphagia, oropharyngeal phase: Secondary | ICD-10-CM | POA: Diagnosis not present

## 2017-05-27 DIAGNOSIS — E041 Nontoxic single thyroid nodule: Secondary | ICD-10-CM | POA: Diagnosis not present

## 2017-05-27 DIAGNOSIS — Z8639 Personal history of other endocrine, nutritional and metabolic disease: Secondary | ICD-10-CM | POA: Diagnosis not present

## 2017-05-28 NOTE — Progress Notes (Signed)
PT is aware.

## 2017-05-28 NOTE — Progress Notes (Signed)
LMOM to call.

## 2017-05-28 NOTE — Progress Notes (Signed)
Normal thyroid with small nodule without need for biopsy. Please let patient know to keep follow-up with ENT.

## 2017-06-03 ENCOUNTER — Telehealth: Payer: Self-pay | Admitting: Gastroenterology

## 2017-06-03 NOTE — Telephone Encounter (Signed)
Hi Darl Pikes! Any path from 2016 from EGD?

## 2017-06-04 NOTE — Telephone Encounter (Signed)
I sent another request to Dr Hendricks Milo office

## 2017-07-02 NOTE — Telephone Encounter (Signed)
Pathology from EGD Aug 2016: Benign duodenum. No esophageal biopsies. I do not see where there is any biopsy-proven Barrett's.

## 2017-08-12 ENCOUNTER — Ambulatory Visit: Payer: BLUE CROSS/BLUE SHIELD | Admitting: Gastroenterology

## 2017-08-16 DIAGNOSIS — Z6835 Body mass index (BMI) 35.0-35.9, adult: Secondary | ICD-10-CM | POA: Diagnosis not present

## 2017-08-16 DIAGNOSIS — F3341 Major depressive disorder, recurrent, in partial remission: Secondary | ICD-10-CM | POA: Diagnosis not present

## 2017-08-28 DIAGNOSIS — H35413 Lattice degeneration of retina, bilateral: Secondary | ICD-10-CM | POA: Diagnosis not present

## 2017-08-28 DIAGNOSIS — H04123 Dry eye syndrome of bilateral lacrimal glands: Secondary | ICD-10-CM | POA: Diagnosis not present

## 2017-09-03 DIAGNOSIS — X58XXXA Exposure to other specified factors, initial encounter: Secondary | ICD-10-CM | POA: Diagnosis not present

## 2017-09-03 DIAGNOSIS — L509 Urticaria, unspecified: Secondary | ICD-10-CM | POA: Diagnosis not present

## 2017-09-03 DIAGNOSIS — I499 Cardiac arrhythmia, unspecified: Secondary | ICD-10-CM | POA: Diagnosis not present

## 2017-09-03 DIAGNOSIS — G35 Multiple sclerosis: Secondary | ICD-10-CM | POA: Diagnosis not present

## 2017-09-03 DIAGNOSIS — Z880 Allergy status to penicillin: Secondary | ICD-10-CM | POA: Diagnosis not present

## 2017-09-03 DIAGNOSIS — S39012A Strain of muscle, fascia and tendon of lower back, initial encounter: Secondary | ICD-10-CM | POA: Diagnosis not present

## 2017-09-03 DIAGNOSIS — R109 Unspecified abdominal pain: Secondary | ICD-10-CM | POA: Diagnosis not present

## 2017-09-03 DIAGNOSIS — R110 Nausea: Secondary | ICD-10-CM | POA: Diagnosis not present

## 2017-09-03 DIAGNOSIS — Z79899 Other long term (current) drug therapy: Secondary | ICD-10-CM | POA: Diagnosis not present

## 2017-09-03 DIAGNOSIS — I10 Essential (primary) hypertension: Secondary | ICD-10-CM | POA: Diagnosis not present

## 2017-09-03 DIAGNOSIS — M545 Low back pain: Secondary | ICD-10-CM | POA: Diagnosis not present

## 2017-09-03 DIAGNOSIS — E785 Hyperlipidemia, unspecified: Secondary | ICD-10-CM | POA: Diagnosis not present

## 2017-09-09 DIAGNOSIS — M5416 Radiculopathy, lumbar region: Secondary | ICD-10-CM | POA: Diagnosis not present

## 2017-09-09 DIAGNOSIS — M1990 Unspecified osteoarthritis, unspecified site: Secondary | ICD-10-CM | POA: Diagnosis not present

## 2017-09-09 DIAGNOSIS — G35 Multiple sclerosis: Secondary | ICD-10-CM | POA: Diagnosis not present

## 2017-09-09 DIAGNOSIS — G894 Chronic pain syndrome: Secondary | ICD-10-CM | POA: Diagnosis not present

## 2017-09-09 DIAGNOSIS — M792 Neuralgia and neuritis, unspecified: Secondary | ICD-10-CM | POA: Diagnosis not present

## 2017-09-11 ENCOUNTER — Ambulatory Visit (INDEPENDENT_AMBULATORY_CARE_PROVIDER_SITE_OTHER): Payer: BLUE CROSS/BLUE SHIELD | Admitting: Gastroenterology

## 2017-09-11 ENCOUNTER — Encounter: Payer: Self-pay | Admitting: Gastroenterology

## 2017-09-11 ENCOUNTER — Encounter: Payer: Self-pay | Admitting: *Deleted

## 2017-09-11 VITALS — BP 151/95 | HR 60 | Temp 97.6°F | Ht 63.0 in | Wt 200.8 lb

## 2017-09-11 DIAGNOSIS — K5900 Constipation, unspecified: Secondary | ICD-10-CM | POA: Diagnosis not present

## 2017-09-11 DIAGNOSIS — R1013 Epigastric pain: Secondary | ICD-10-CM | POA: Diagnosis not present

## 2017-09-11 MED ORDER — PANTOPRAZOLE SODIUM 40 MG PO TBEC: 40.0000 mg | DELAYED_RELEASE_TABLET | Freq: Two times a day (BID) | ORAL | 3 refills | Status: DC

## 2017-09-11 NOTE — Progress Notes (Signed)
Referring Provider: Erasmo Downer, NP Primary Care Physician:  Erasmo Downer, NP Primary GI: Dr. Darrick Penna   Chief Complaint  Patient presents with  . Abdominal Pain    lots of bloating/discomfort after eating or drinking liquids  . Nausea    no vomiting    HPI:   Madison Oliver is a 47 y.o. female presenting today with a self-reported history of Barrett's esophagus, but EGD in 2016 at outside facility without evidence for this (Path without esophageal biopsies at that time) and most recent EGD with benign-appearing esophageal stricture due to GERD s/p dilation, small hiatal hernia, mild gastritis/duodenitis due to Naproxen. No obvious evidence of Barrett's on exam. Redundant left colon, otherwise normal, external/internal hemorrhoids at time of colonoscopy.   Intermittent constipation: Linzess caused diarrhea. Decreased to 72 mcg at last visit. Still with loose bowels, urgency.   Dysphagia: s/p most recent EGD Dec 2018 s/p dilation. BPE: normal motility. US thyroid with normal thyroid and small nodule without need for biopsy.   ENT: hasn't seen yet. Still feels like something is in back of neck.   Notes postprandial epigastric abdominal pain and bloating after eating. Has to take food home with her as she can't finish it. PPI BID. Gallbadder present.    Past Medical History:  Diagnosis Date  . Barrett esophagus   . Chronic pain   . Depression   . GERD (gastroesophageal reflux disease)   . HTN (hypertension)   . Hypercholesterolemia   . MS (multiple sclerosis) (HCC)   . RLS (restless legs syndrome)   . Sleep apnea    not using CPAP; cannot tolerate, PCP not aware.    Past Surgical History:  Procedure Laterality Date  . ABDOMINAL HYSTERECTOMY     still has ovaries  . BIOPSY  01/08/2017   Procedure: BIOPSY;  Surgeon: West Bali, MD;  [facility]: [facility];  Service: Endoscopy;;  gastric  . COLONOSCOPY WITH PROPOFOL N/A 01/08/2017   Redundant left colon,  otherwise normal, external/internal hemorrhoids  . ESOPHAGOGASTRODUODENOSCOPY  2016   outside facility:  hiatal hernia, gastirtis, esophageal stricture. path with benign fragments of duodenum without pathological change.   . ESOPHAGOGASTRODUODENOSCOPY (EGD) WITH PROPOFOL N/A 01/08/2017   Benign-appearing esophageal stricture due to GERD s/p dilation, small hiatal hernia, mild gastritis/duodenitis due to Naproxen  . SAVORY DILATION N/A 01/08/2017   Procedure: SAVORY DILATION;  Surgeon: West Bali, MD;  Location: [facility];  Service: Endoscopy;  Laterality: N/A;  . TONSILLECTOMY    . TUBAL LIGATION      Current Outpatient Medications  Medication Sig Dispense Refill  . baclofen (LIORESAL) 20 MG tablet Take 20 mg by mouth 4 (four) times daily.    . carvedilol (COREG) 6.25 MG tablet Take 6.25 mg by mouth 2 (two) times daily with a meal.    . Cholecalciferol (VITAMIN D3) 2000 units TABS Take 4,000 Units by mouth daily.    . citalopram (CELEXA) 40 MG tablet Take 40 mg by mouth daily.    . Dimethyl Fumarate (TECFIDERA) 240 MG CPDR Take 480 mg by mouth 2 (two) times daily.    Marland Kitchen lidocaine (LIDODERM) 5 % Place 1 patch onto the skin daily as needed (for back pain.). Remove & Discard patch within 12 hours or as directed by MD    . losartan-hydrochlorothiazide (HYZAAR) 100-25 MG tablet Take 1 tablet by mouth daily.     . mirabegron ER (MYRBETRIQ) 25 MG TB24 tablet Take 25 mg by  mouth daily.    . naproxen (NAPROSYN) 500 MG tablet Take 500 mg by mouth 2 (two) times daily as needed (for pain.).     Marland Kitchen pantoprazole (PROTONIX) 40 MG tablet Take 1 tablet (40 mg total) by mouth 2 (two) times daily before a meal. 180 tablet 3  . Polyethyl Glycol-Propyl Glycol (LUBRICANT EYE DROPS) 0.4-0.3 % SOLN Place 1-2 drops into both eyes 3 (three) times daily as needed (for dry eyes.).    Marland Kitchen Pregabalin ER (LYRICA CR) 165 MG TB24 Take 165 mg by mouth 3 (three) times daily.    . simvastatin (ZOCOR) 40 MG tablet Take 40 mg  by mouth daily.    . solifenacin (VESICARE) 5 MG tablet Take 5 mg by mouth daily.    Marland Kitchen zolpidem (AMBIEN) 10 MG tablet Take 10 mg by mouth at bedtime.      No current facility-administered medications for this visit.     Allergies as of 09/11/2017 - Review Complete 09/11/2017  Allergen Reaction Noted  . Penicillins Other (See Comments) 05/28/2012  . Tizanidine Swelling 12/25/2016    Family History  Problem Relation Age of Onset  . Colon polyps Mother 68  . Colon cancer Neg Hx     Social History   Socioeconomic History  . Marital status: Married    Spouse name: Not on file  . Number of children: Not on file  . Years of education: Not on file  . Highest education level: Not on file  Occupational History  . Occupation: disability  Social Needs  . Financial resource strain: Not on file  . Food insecurity:    Worry: Not on file    Inability: Not on file  . Transportation needs:    Medical: Not on file    Non-medical: Not on file  Tobacco Use  . Smoking status: Never Smoker  . Smokeless tobacco: Never Used  Substance and Sexual Activity  . Alcohol use: No  . Drug use: No  . Sexual activity: Yes    Birth control/protection: Surgical  Lifestyle  . Physical activity:    Days per week: Not on file    Minutes per session: Not on file  . Stress: Not on file  Relationships  . Social connections:    Talks on phone: Not on file    Gets together: Not on file    Attends religious service: Not on file    Active member of club or organization: Not on file    Attends meetings of clubs or organizations: Not on file    Relationship status: Not on file  Other Topics Concern  . Not on file  Social History Narrative  . Not on file    Review of Systems: Gen: Denies fever, chills, anorexia. Denies fatigue, weakness, weight loss.  CV: Denies chest pain, palpitations, syncope, peripheral edema, and claudication. Resp: Denies dyspnea at rest, cough, wheezing, coughing up blood,  and pleurisy. GI: see HPI  Derm: Denies rash, itching, dry skin Psych: Denies depression, anxiety, memory loss, confusion. No homicidal or suicidal ideation.  Heme: Denies bruising, bleeding, and enlarged lymph nodes.  Physical Exam: BP (!) 151/95   Pulse 60   Temp 97.6 F (36.4 C) (Oral)   Ht 5\' 3"  (1.6 m)   Wt 200 lb 12.8 oz (91.1 kg)   BMI 35.57 kg/m  General:   Alert and oriented. No distress noted. Pleasant and cooperative.  Head:  Normocephalic and atraumatic. Eyes:  Conjuctiva clear without scleral icterus. Mouth:  Oral mucosa pink and moist.  Abdomen:  +BS, soft, non-tender and non-distended. No rebound or guarding. No HSM or masses noted. Msk:  Symmetrical without gross deformities. Normal posture. Extremities:  Without edema. Neurologic:  Alert and  oriented x4 Psych:  Alert and cooperative. Normal mood and affect.

## 2017-09-11 NOTE — Patient Instructions (Signed)
For constipation: let's stop Linzess. Start Amitiza one gelcap twice a day (with food to avoid nausea, as this is a common side effect if not taken with food). Let me know how this works for you!  I have ordered the ultrasound of your abdomen. You may need a HIDA scan.  Further recommendations to follow!  It was a pleasure to see you today. I strive to create trusting relationships with patients to provide genuine, compassionate, and quality care. I value your feedback. If you receive a survey regarding your visit,  I greatly appreciate you taking time to fill this out.   Gelene Mink, PhD, ANP-BC [facility]

## 2017-09-16 NOTE — Assessment & Plan Note (Signed)
EGD on file recently. Symptoms postprandial with associated bloating after eating. Continues with PPI BID. Will pursue US abdomen to assess for gallstones. May need HIDA.

## 2017-09-16 NOTE — Assessment & Plan Note (Signed)
Linzess 72 mcg with diarrhea. Trial of Amitiza 8 mcg po BID with food.

## 2017-09-17 NOTE — Progress Notes (Signed)
cc'd to pcp 

## 2017-09-18 ENCOUNTER — Ambulatory Visit (HOSPITAL_COMMUNITY)
Admission: RE | Admit: 2017-09-18 | Discharge: 2017-09-18 | Disposition: A | Discharge status: Home / Self Care | Payer: BLUE CROSS/BLUE SHIELD | Source: Ambulatory Visit | Attending: Gastroenterology | Admitting: Gastroenterology

## 2017-09-18 DIAGNOSIS — R1013 Epigastric pain: Secondary | ICD-10-CM | POA: Diagnosis not present

## 2017-09-18 DIAGNOSIS — K760 Fatty (change of) liver, not elsewhere classified: Secondary | ICD-10-CM | POA: Diagnosis not present

## 2017-09-19 ENCOUNTER — Other Ambulatory Visit: Payer: Self-pay | Admitting: *Deleted

## 2017-09-19 DIAGNOSIS — R1013 Epigastric pain: Secondary | ICD-10-CM

## 2017-09-19 NOTE — Progress Notes (Signed)
Fatty liver. No gallstones. Recommend HIDA>

## 2017-09-23 ENCOUNTER — Telehealth: Payer: Self-pay

## 2017-09-23 ENCOUNTER — Telehealth: Payer: Self-pay | Admitting: *Deleted

## 2017-09-23 NOTE — Telephone Encounter (Signed)
Pt is returning MS call.

## 2017-09-23 NOTE — Telephone Encounter (Signed)
No PA is required for HIDA per [facility].

## 2017-09-23 NOTE — Telephone Encounter (Signed)
See result note.  

## 2017-10-02 ENCOUNTER — Encounter (HOSPITAL_COMMUNITY): Payer: Self-pay

## 2017-10-02 ENCOUNTER — Encounter (HOSPITAL_COMMUNITY)
Admission: RE | Admit: 2017-10-02 | Discharge: 2017-10-02 | Disposition: A | Discharge status: Home / Self Care | Payer: BLUE CROSS/BLUE SHIELD | Source: Ambulatory Visit | Attending: Gastroenterology | Admitting: Gastroenterology

## 2017-10-02 DIAGNOSIS — R1013 Epigastric pain: Secondary | ICD-10-CM | POA: Diagnosis not present

## 2017-10-02 MED ORDER — TECHNETIUM TC 99M MEBROFENIN IV KIT
5.0000 | PACK | Freq: Once | INTRAVENOUS | Status: AC | PRN
  Administered 2017-10-02: 5.1 via INTRAVENOUS

## 2017-10-04 NOTE — Progress Notes (Signed)
HIDA is normal. Did she have symptoms with this? I would recommend starting Dexilant once daily. Stop Protonix. PR in 10-14 days. Any nausea?

## 2017-10-04 NOTE — Progress Notes (Signed)
Tried to call, mailbox full and could not leave a message.

## 2017-10-08 NOTE — Progress Notes (Signed)
Mailbox is full and cannot accept messages. Mailing a letter for pt to call.

## 2017-10-17 ENCOUNTER — Telehealth: Payer: Self-pay | Admitting: Gastroenterology

## 2017-10-17 MED ORDER — DEXLANSOPRAZOLE 60 MG PO CPDR: 60.0000 mg | DELAYED_RELEASE_CAPSULE | Freq: Every day | ORAL | 3 refills | Status: DC

## 2017-10-17 NOTE — Telephone Encounter (Signed)
PT is aware. Forwarding to [place] to nic F/u in Dec 2019.

## 2017-10-17 NOTE — Telephone Encounter (Signed)
I informed pt of her results of HIDA. She said she had a lot of abdominal cramps with the procedure. She would like the prescription for the Dexilant sent to her pharmacy on file. She [place] 45 min away and hard to come for samples.  She will stop the pantoprazole when she starts Dexilant. Tobi Bastos, please send in Rx.

## 2017-10-17 NOTE — Telephone Encounter (Signed)
I have sent Dexilant to pharmacy. Would recommend avoiding fatty foods, monitor symptoms. If persistent pain after eating, specifically pain in RUQ and nausea, could consider referral to surgery. However, it does not mean she would have resolution of symptoms. Let's see how she does with Dexilant. Routine follow-up in Dec 2019.

## 2017-10-17 NOTE — Addendum Note (Signed)
Addended by: Gelene Mink on: 10/17/2017 11:51 AM   Modules accepted: Orders

## 2017-10-17 NOTE — Telephone Encounter (Signed)
LMOM for a return call.  

## 2017-10-17 NOTE — Telephone Encounter (Signed)
LMOM for a return call. Letter was mailed to her on 10/08/2017 for a return call for HIDA results.

## 2017-10-17 NOTE — Telephone Encounter (Signed)
Pt said that her phone has been cut off and didn't know if maybe the nurse has tried calling her about her test results. Please call [phone]

## 2017-10-18 ENCOUNTER — Encounter: Payer: Self-pay | Admitting: Gastroenterology

## 2017-10-18 NOTE — Telephone Encounter (Signed)
PATIENT SCHEDULED AND LETTER SENT  °

## 2017-10-22 DIAGNOSIS — M5431 Sciatica, right side: Secondary | ICD-10-CM | POA: Diagnosis not present

## 2017-10-22 DIAGNOSIS — I10 Essential (primary) hypertension: Secondary | ICD-10-CM | POA: Diagnosis not present

## 2017-10-22 DIAGNOSIS — E782 Mixed hyperlipidemia: Secondary | ICD-10-CM | POA: Diagnosis not present

## 2017-10-22 DIAGNOSIS — E049 Nontoxic goiter, unspecified: Secondary | ICD-10-CM | POA: Diagnosis not present

## 2017-10-22 DIAGNOSIS — G35 Multiple sclerosis: Secondary | ICD-10-CM | POA: Diagnosis not present

## 2017-10-22 DIAGNOSIS — R739 Hyperglycemia, unspecified: Secondary | ICD-10-CM | POA: Diagnosis not present

## 2017-10-25 DIAGNOSIS — G35 Multiple sclerosis: Secondary | ICD-10-CM | POA: Diagnosis not present

## 2017-10-25 DIAGNOSIS — R9082 White matter disease, unspecified: Secondary | ICD-10-CM | POA: Diagnosis not present

## 2017-11-05 DIAGNOSIS — G894 Chronic pain syndrome: Secondary | ICD-10-CM | POA: Diagnosis not present

## 2017-11-05 DIAGNOSIS — R002 Palpitations: Secondary | ICD-10-CM | POA: Diagnosis not present

## 2017-11-05 DIAGNOSIS — M25542 Pain in joints of left hand: Secondary | ICD-10-CM | POA: Diagnosis not present

## 2017-11-05 DIAGNOSIS — G35 Multiple sclerosis: Secondary | ICD-10-CM | POA: Diagnosis not present

## 2017-11-05 DIAGNOSIS — G4700 Insomnia, unspecified: Secondary | ICD-10-CM | POA: Diagnosis not present

## 2017-11-05 DIAGNOSIS — I10 Essential (primary) hypertension: Secondary | ICD-10-CM | POA: Diagnosis not present

## 2017-11-05 DIAGNOSIS — E785 Hyperlipidemia, unspecified: Secondary | ICD-10-CM | POA: Diagnosis not present

## 2017-11-05 DIAGNOSIS — M25541 Pain in joints of right hand: Secondary | ICD-10-CM | POA: Diagnosis not present

## 2017-11-05 DIAGNOSIS — R202 Paresthesia of skin: Secondary | ICD-10-CM | POA: Diagnosis not present

## 2017-11-05 DIAGNOSIS — F329 Major depressive disorder, single episode, unspecified: Secondary | ICD-10-CM | POA: Diagnosis not present

## 2017-11-05 DIAGNOSIS — Z79899 Other long term (current) drug therapy: Secondary | ICD-10-CM | POA: Diagnosis not present

## 2017-11-05 DIAGNOSIS — R159 Full incontinence of feces: Secondary | ICD-10-CM | POA: Diagnosis not present

## 2017-11-05 DIAGNOSIS — M79605 Pain in left leg: Secondary | ICD-10-CM | POA: Diagnosis not present

## 2017-11-05 DIAGNOSIS — Z6836 Body mass index (BMI) 36.0-36.9, adult: Secondary | ICD-10-CM | POA: Diagnosis not present

## 2017-11-05 DIAGNOSIS — M542 Cervicalgia: Secondary | ICD-10-CM | POA: Diagnosis not present

## 2018-01-24 ENCOUNTER — Ambulatory Visit (INDEPENDENT_AMBULATORY_CARE_PROVIDER_SITE_OTHER): Payer: BLUE CROSS/BLUE SHIELD | Admitting: Gastroenterology

## 2018-01-24 ENCOUNTER — Encounter: Payer: Self-pay | Admitting: Gastroenterology

## 2018-01-24 VITALS — BP 131/84 | HR 70 | Temp 97.3°F | Ht 64.0 in | Wt 201.2 lb

## 2018-01-24 DIAGNOSIS — K5900 Constipation, unspecified: Secondary | ICD-10-CM | POA: Diagnosis not present

## 2018-01-24 DIAGNOSIS — R1030 Lower abdominal pain, unspecified: Secondary | ICD-10-CM | POA: Diagnosis not present

## 2018-01-24 MED ORDER — LUBIPROSTONE 24 MCG PO CAPS: 24.0000 ug | ORAL_CAPSULE | Freq: Two times a day (BID) | ORAL | 3 refills | Status: DC

## 2018-01-24 MED ORDER — DEXLANSOPRAZOLE 60 MG PO CPDR: 60.0000 mg | DELAYED_RELEASE_CAPSULE | Freq: Every day | ORAL | 3 refills | Status: DC

## 2018-01-24 NOTE — Assessment & Plan Note (Signed)
Likely secondary to constipation. Does not seem typical of diverticulitis or other acute etiology; I have asked her to call me next week with an update. If no improvement, will pursue CT scan.

## 2018-01-24 NOTE — Progress Notes (Signed)
Referring Provider: Erasmo Downer, NP Primary Care Physician:  Erasmo Downer, NP  Primary GI: Dr. Darrick Penna   Chief Complaint  Patient presents with  . Abdominal Pain    left side- comes/goes with eating. Feels like spasms  . Nausea  . Constipation    BM twice a week, straining.    HPI:   Madison Oliver is a 47 y.o. female presenting today with a history of self-reported history of Barrett's esophagus, but EGD in 2016 at outside facility without evidence for this (Path without esophageal biopsies at that time)and most recent EGD in 2018 with benign-appearing esophageal stricture due to GERD s/p dilation, small hiatal hernia, mild gastritis/duodenitis due to Naproxen. No obviousevidence of Barrett's on exam. Redundant left colon, otherwise normal, external/internal hemorrhoids at time of colonoscopy.  Constipation: Linzess even at low dosages caused diarrhea. Provided Amitiza 8 mcg BID at last visit.   Dysphagia: EGD 2018 s/p dilation, BPE normal motility, US thyroid with normal thyroid and small nodule without need for biopsy.   Abdominal pain: postprandially in the past. [place] with fatty liver, HIDA with normal EF. Reproducible symptoms. Now with left sided back pain that wraps around to LLQ. Present for about a week. No fever/chills. Not associated with movement.   Past Medical History:  Diagnosis Date  . Barrett esophagus   . Chronic pain   . Depression   . GERD (gastroesophageal reflux disease)   . HTN (hypertension)   . Hypercholesterolemia   . MS (multiple sclerosis) (HCC)   . RLS (restless legs syndrome)   . Sleep apnea    not using CPAP; cannot tolerate, PCP not aware.    Past Surgical History:  Procedure Laterality Date  . ABDOMINAL HYSTERECTOMY     still has ovaries  . BIOPSY  01/08/2017   Procedure: BIOPSY;  Surgeon: West Bali, MD;  [facility];  Service: Endoscopy;;  gastric  . COLONOSCOPY WITH PROPOFOL N/A 01/08/2017   Redundant  left colon, otherwise normal, external/internal hemorrhoids  . ESOPHAGOGASTRODUODENOSCOPY  2016   outside facility:  hiatal hernia, gastirtis, esophageal stricture. path with benign fragments of duodenum without pathological change.   . ESOPHAGOGASTRODUODENOSCOPY (EGD) WITH PROPOFOL N/A 01/08/2017   Benign-appearing esophageal stricture due to GERD s/p dilation, small hiatal hernia, mild gastritis/duodenitis due to Naproxen  . SAVORY DILATION N/A 01/08/2017   Procedure: SAVORY DILATION;  Surgeon: West Bali, MD;  [facility];  Service: Endoscopy;  Laterality: N/A;  . TONSILLECTOMY    . TUBAL LIGATION      Current Outpatient Medications  Medication Sig Dispense Refill  . baclofen (LIORESAL) 20 MG tablet Take 20 mg by mouth 4 (four) times daily.    . carvedilol (COREG) 6.25 MG tablet Take 6.25 mg by mouth 2 (two) times daily with a meal.    . Cholecalciferol (VITAMIN D3) 2000 units TABS Take 4,000 Units by mouth daily.    . citalopram (CELEXA) 40 MG tablet Take 40 mg by mouth daily.    Marland Kitchen dexlansoprazole (DEXILANT) 60 MG capsule Take 1 capsule (60 mg total) by mouth daily. 90 capsule 3  . Dimethyl Fumarate (TECFIDERA) 240 MG CPDR Take 480 mg by mouth 2 (two) times daily.    . hydrochlorothiazide (HYDRODIURIL) 25 MG tablet Take 25 mg by mouth daily.    Marland Kitchen lidocaine (LIDODERM) 5 % Place 1 patch onto the skin daily as needed (for back pain.). Remove & Discard patch within 12 hours or as  directed by MD    . losartan (COZAAR) 100 MG tablet Take 100 mg by mouth daily.    . mirabegron ER (MYRBETRIQ) 25 MG TB24 tablet Take 25 mg by mouth daily.    . naproxen (NAPROSYN) 500 MG tablet Take 500 mg by mouth 2 (two) times daily as needed (for pain.).     Marland Kitchen Polyethyl Glycol-Propyl Glycol (LUBRICANT EYE DROPS) 0.4-0.3 % SOLN Place 1-2 drops into both eyes 3 (three) times daily as needed (for dry eyes.).    Marland Kitchen Pregabalin ER (LYRICA CR) 165 MG TB24 Take 165 mg by mouth 3 (three) times daily.    .  simvastatin (ZOCOR) 40 MG tablet Take 40 mg by mouth daily.    Marland Kitchen zolpidem (AMBIEN) 10 MG tablet Take 10 mg by mouth at bedtime.     Marland Kitchen lubiprostone (AMITIZA) 24 MCG capsule Take 1 capsule (24 mcg total) by mouth 2 (two) times daily with a meal. 180 capsule 3   No current facility-administered medications for this visit.     Allergies as of 01/24/2018 - Review Complete 01/24/2018  Allergen Reaction Noted  . Penicillins Other (See Comments) 05/28/2012  . Tizanidine Swelling 12/25/2016    Family History  Problem Relation Age of Onset  . Colon polyps Mother 61  . Colon cancer Neg Hx     Social History   Socioeconomic History  . Marital status: Married    Spouse name: Not on file  . Number of children: Not on file  . Years of education: Not on file  . Highest education level: Not on file  Occupational History  . Occupation: disability  Social Needs  . Financial resource strain: Not on file  . Food insecurity:    Worry: Not on file    Inability: Not on file  . Transportation needs:    Medical: Not on file    Non-medical: Not on file  Tobacco Use  . Smoking status: Never Smoker  . Smokeless tobacco: Never Used  Substance and Sexual Activity  . Alcohol use: No  . Drug use: No  . Sexual activity: Yes    Birth control/protection: Surgical  Lifestyle  . Physical activity:    Days per week: Not on file    Minutes per session: Not on file  . Stress: Not on file  Relationships  . Social connections:    Talks on phone: Not on file    Gets together: Not on file    Attends religious service: Not on file    Active member of club or organization: Not on file    Attends meetings of clubs or organizations: Not on file    Relationship status: Not on file  Other Topics Concern  . Not on file  Social History Narrative  . Not on file    Review of Systems: Gen: Denies fever, chills, anorexia. Denies fatigue, weakness, weight loss.  CV: Denies chest pain, palpitations, syncope,  peripheral edema, and claudication. Resp: Denies dyspnea at rest, cough, wheezing, coughing up blood, and pleurisy. GI: see HPI. Derm: Denies rash, itching, dry skin Psych: Denies depression, anxiety, memory loss, confusion. No homicidal or suicidal ideation.  Heme: Denies bruising, bleeding, and enlarged lymph nodes.  Physical Exam: BP 131/84   Pulse 70   Temp (!) 97.3 F (36.3 C) (Oral)   Ht 5\' 4"  (1.626 m)   Wt 201 lb 3.2 oz (91.3 kg)   BMI 34.54 kg/m  General:   Alert and oriented. No distress noted. Pleasant  and cooperative.  Head:  Normocephalic and atraumatic. Eyes:  Conjuctiva clear without scleral icterus. Mouth:  Oral mucosa pink and moist.  Abdomen:  +BS, soft, mild TTP LLQ and non-distended. No rebound or guarding. No HSM or masses noted. Msk:  Symmetrical without gross deformities. Normal posture. Extremities:  Without edema. Neurologic:  Alert and  oriented x4 Psych:  Alert and cooperative. Normal mood and affect.

## 2018-01-24 NOTE — Patient Instructions (Signed)
I have provided Amitiza to take twice a day with food (to avoid nausea). This is for constipation. I sent to the pharmacy as well.  Please call me on Monday with an update on how you are doing! We will need to do a CT scan if you are not better.  Have a [place] Christmas!  We will see you in 3 months!  I enjoyed seeing you again today! As you know, I value our relationship and want to provide genuine, compassionate, and quality care. I welcome your feedback. If you receive a survey regarding your visit,  I greatly appreciate you taking time to fill this out. See you next time!  Gelene Mink, PhD, ANP-BC [facility]

## 2018-01-24 NOTE — Assessment & Plan Note (Signed)
Intolerant of Linzess due to diarrhea regardless of dosing. Will trial Amitiza 24 mcg BID with food. I have sent to pharmacy. 3 month return.

## 2018-01-27 NOTE — Progress Notes (Signed)
CC'D TO PCP °

## 2018-02-21 DIAGNOSIS — R9082 White matter disease, unspecified: Secondary | ICD-10-CM | POA: Diagnosis not present

## 2018-02-21 DIAGNOSIS — Z6837 Body mass index (BMI) 37.0-37.9, adult: Secondary | ICD-10-CM | POA: Diagnosis not present

## 2018-02-25 DIAGNOSIS — Z79899 Other long term (current) drug therapy: Secondary | ICD-10-CM | POA: Diagnosis not present

## 2018-02-25 DIAGNOSIS — G35 Multiple sclerosis: Secondary | ICD-10-CM | POA: Diagnosis not present

## 2018-02-25 DIAGNOSIS — M792 Neuralgia and neuritis, unspecified: Secondary | ICD-10-CM | POA: Diagnosis not present

## 2018-02-25 DIAGNOSIS — M25572 Pain in left ankle and joints of left foot: Secondary | ICD-10-CM | POA: Diagnosis not present

## 2018-02-25 DIAGNOSIS — Z880 Allergy status to penicillin: Secondary | ICD-10-CM | POA: Diagnosis not present

## 2018-02-25 DIAGNOSIS — G894 Chronic pain syndrome: Secondary | ICD-10-CM | POA: Diagnosis not present

## 2018-02-25 DIAGNOSIS — S99912A Unspecified injury of left ankle, initial encounter: Secondary | ICD-10-CM | POA: Diagnosis not present

## 2018-03-03 DIAGNOSIS — R21 Rash and other nonspecific skin eruption: Secondary | ICD-10-CM | POA: Diagnosis not present

## 2018-03-03 DIAGNOSIS — K589 Irritable bowel syndrome without diarrhea: Secondary | ICD-10-CM | POA: Diagnosis not present

## 2018-03-03 DIAGNOSIS — K219 Gastro-esophageal reflux disease without esophagitis: Secondary | ICD-10-CM | POA: Diagnosis not present

## 2018-03-03 DIAGNOSIS — L299 Pruritus, unspecified: Secondary | ICD-10-CM | POA: Diagnosis not present

## 2018-03-03 DIAGNOSIS — T7840XA Allergy, unspecified, initial encounter: Secondary | ICD-10-CM | POA: Diagnosis not present

## 2018-03-03 DIAGNOSIS — I10 Essential (primary) hypertension: Secondary | ICD-10-CM | POA: Diagnosis not present

## 2018-03-03 DIAGNOSIS — E559 Vitamin D deficiency, unspecified: Secondary | ICD-10-CM | POA: Diagnosis not present

## 2018-03-03 DIAGNOSIS — T380X5A Adverse effect of glucocorticoids and synthetic analogues, initial encounter: Secondary | ICD-10-CM | POA: Diagnosis not present

## 2018-03-03 DIAGNOSIS — Z888 Allergy status to other drugs, medicaments and biological substances status: Secondary | ICD-10-CM | POA: Diagnosis not present

## 2018-03-03 DIAGNOSIS — Z880 Allergy status to penicillin: Secondary | ICD-10-CM | POA: Diagnosis not present

## 2018-03-03 DIAGNOSIS — F329 Major depressive disorder, single episode, unspecified: Secondary | ICD-10-CM | POA: Diagnosis not present

## 2018-03-03 DIAGNOSIS — X58XXXA Exposure to other specified factors, initial encounter: Secondary | ICD-10-CM | POA: Diagnosis not present

## 2018-03-18 DIAGNOSIS — T380X5A Adverse effect of glucocorticoids and synthetic analogues, initial encounter: Secondary | ICD-10-CM | POA: Diagnosis not present

## 2018-03-18 DIAGNOSIS — Z7952 Long term (current) use of systemic steroids: Secondary | ICD-10-CM | POA: Diagnosis not present

## 2018-03-18 DIAGNOSIS — R002 Palpitations: Secondary | ICD-10-CM | POA: Diagnosis not present

## 2018-03-18 DIAGNOSIS — E785 Hyperlipidemia, unspecified: Secondary | ICD-10-CM | POA: Diagnosis not present

## 2018-03-18 DIAGNOSIS — I499 Cardiac arrhythmia, unspecified: Secondary | ICD-10-CM | POA: Diagnosis not present

## 2018-03-18 DIAGNOSIS — Z79899 Other long term (current) drug therapy: Secondary | ICD-10-CM | POA: Diagnosis not present

## 2018-03-18 DIAGNOSIS — Z880 Allergy status to penicillin: Secondary | ICD-10-CM | POA: Diagnosis not present

## 2018-03-18 DIAGNOSIS — F329 Major depressive disorder, single episode, unspecified: Secondary | ICD-10-CM | POA: Diagnosis not present

## 2018-03-18 DIAGNOSIS — G35 Multiple sclerosis: Secondary | ICD-10-CM | POA: Diagnosis not present

## 2018-03-18 DIAGNOSIS — G4700 Insomnia, unspecified: Secondary | ICD-10-CM | POA: Diagnosis not present

## 2018-03-18 DIAGNOSIS — Z6835 Body mass index (BMI) 35.0-35.9, adult: Secondary | ICD-10-CM | POA: Diagnosis not present

## 2018-03-18 DIAGNOSIS — I10 Essential (primary) hypertension: Secondary | ICD-10-CM | POA: Diagnosis not present

## 2018-03-18 DIAGNOSIS — T50905A Adverse effect of unspecified drugs, medicaments and biological substances, initial encounter: Secondary | ICD-10-CM | POA: Diagnosis not present

## 2018-03-18 DIAGNOSIS — R079 Chest pain, unspecified: Secondary | ICD-10-CM | POA: Diagnosis not present

## 2018-03-21 DIAGNOSIS — Z6836 Body mass index (BMI) 36.0-36.9, adult: Secondary | ICD-10-CM | POA: Diagnosis not present

## 2018-03-21 DIAGNOSIS — F3341 Major depressive disorder, recurrent, in partial remission: Secondary | ICD-10-CM | POA: Diagnosis not present

## 2018-04-04 DIAGNOSIS — G894 Chronic pain syndrome: Secondary | ICD-10-CM | POA: Diagnosis not present

## 2018-04-04 DIAGNOSIS — R202 Paresthesia of skin: Secondary | ICD-10-CM | POA: Diagnosis not present

## 2018-04-04 DIAGNOSIS — G35 Multiple sclerosis: Secondary | ICD-10-CM | POA: Diagnosis not present

## 2018-04-04 DIAGNOSIS — R5383 Other fatigue: Secondary | ICD-10-CM | POA: Diagnosis not present

## 2018-04-25 ENCOUNTER — Ambulatory Visit: Payer: BLUE CROSS/BLUE SHIELD | Admitting: Gastroenterology

## 2018-05-23 DIAGNOSIS — E782 Mixed hyperlipidemia: Secondary | ICD-10-CM | POA: Diagnosis not present

## 2018-05-23 DIAGNOSIS — I10 Essential (primary) hypertension: Secondary | ICD-10-CM | POA: Diagnosis not present

## 2018-05-23 DIAGNOSIS — R739 Hyperglycemia, unspecified: Secondary | ICD-10-CM | POA: Diagnosis not present

## 2018-05-23 DIAGNOSIS — Z713 Dietary counseling and surveillance: Secondary | ICD-10-CM | POA: Diagnosis not present

## 2018-05-31 DIAGNOSIS — R9082 White matter disease, unspecified: Secondary | ICD-10-CM | POA: Diagnosis not present

## 2018-05-31 DIAGNOSIS — M47812 Spondylosis without myelopathy or radiculopathy, cervical region: Secondary | ICD-10-CM | POA: Diagnosis not present

## 2018-05-31 DIAGNOSIS — G35 Multiple sclerosis: Secondary | ICD-10-CM | POA: Diagnosis not present

## 2018-06-11 ENCOUNTER — Ambulatory Visit: Payer: BLUE CROSS/BLUE SHIELD | Admitting: Gastroenterology

## 2018-06-15 DIAGNOSIS — Z880 Allergy status to penicillin: Secondary | ICD-10-CM | POA: Diagnosis not present

## 2018-06-15 DIAGNOSIS — R5381 Other malaise: Secondary | ICD-10-CM | POA: Diagnosis not present

## 2018-06-15 DIAGNOSIS — K589 Irritable bowel syndrome without diarrhea: Secondary | ICD-10-CM | POA: Diagnosis not present

## 2018-06-15 DIAGNOSIS — Z888 Allergy status to other drugs, medicaments and biological substances status: Secondary | ICD-10-CM | POA: Diagnosis not present

## 2018-06-15 DIAGNOSIS — N390 Urinary tract infection, site not specified: Secondary | ICD-10-CM | POA: Diagnosis not present

## 2018-06-15 DIAGNOSIS — K760 Fatty (change of) liver, not elsewhere classified: Secondary | ICD-10-CM | POA: Diagnosis not present

## 2018-06-15 DIAGNOSIS — G35 Multiple sclerosis: Secondary | ICD-10-CM | POA: Diagnosis not present

## 2018-06-15 DIAGNOSIS — R1032 Left lower quadrant pain: Secondary | ICD-10-CM | POA: Diagnosis not present

## 2018-06-15 DIAGNOSIS — I10 Essential (primary) hypertension: Secondary | ICD-10-CM | POA: Diagnosis not present

## 2018-06-15 DIAGNOSIS — E559 Vitamin D deficiency, unspecified: Secondary | ICD-10-CM | POA: Diagnosis not present

## 2018-06-15 DIAGNOSIS — R1033 Periumbilical pain: Secondary | ICD-10-CM | POA: Diagnosis not present

## 2018-06-15 DIAGNOSIS — F329 Major depressive disorder, single episode, unspecified: Secondary | ICD-10-CM | POA: Diagnosis not present

## 2018-06-15 DIAGNOSIS — K219 Gastro-esophageal reflux disease without esophagitis: Secondary | ICD-10-CM | POA: Diagnosis not present

## 2018-06-15 DIAGNOSIS — R110 Nausea: Secondary | ICD-10-CM | POA: Diagnosis not present

## 2018-06-27 ENCOUNTER — Ambulatory Visit: Payer: BLUE CROSS/BLUE SHIELD | Admitting: Gastroenterology

## 2018-07-01 ENCOUNTER — Encounter: Payer: Self-pay | Admitting: Gastroenterology

## 2018-07-01 ENCOUNTER — Other Ambulatory Visit: Payer: Self-pay

## 2018-07-01 ENCOUNTER — Ambulatory Visit (INDEPENDENT_AMBULATORY_CARE_PROVIDER_SITE_OTHER): Payer: BLUE CROSS/BLUE SHIELD | Admitting: Gastroenterology

## 2018-07-01 DIAGNOSIS — R1013 Epigastric pain: Secondary | ICD-10-CM | POA: Diagnosis not present

## 2018-07-01 DIAGNOSIS — K5900 Constipation, unspecified: Secondary | ICD-10-CM | POA: Diagnosis not present

## 2018-07-01 MED ORDER — LUBIPROSTONE 8 MCG PO CAPS: 8.0000 ug | ORAL_CAPSULE | Freq: Two times a day (BID) | ORAL | 3 refills | Status: DC

## 2018-07-01 MED ORDER — PANTOPRAZOLE SODIUM 40 MG PO TBEC: 40.0000 mg | DELAYED_RELEASE_TABLET | Freq: Every day | ORAL | 3 refills | Status: DC

## 2018-07-01 NOTE — Patient Instructions (Addendum)
I have sent in Amitiza at the lower dosage to take twice a day with food. Call me if this is not helpful!  Instead of Prilosec, let's do Protonix once each morning, 30 minutes before breakfast. Prilosec and Celexa can interact together, so I would rather you stay on Protonix.  We will refer you to a surgeon to discuss possible gallbladder removal. It is possible you could still have these symptoms even after it is removed.   We will see you in 4-6 months!  I enjoyed talking with you again today! As you know, I value our relationship and want to provide genuine, compassionate, and quality care. I welcome your feedback. If you receive a survey regarding your visit,  I greatly appreciate you taking time to fill this out. See you next time!  Gelene Mink, PhD, ANP-BC [facility]

## 2018-07-01 NOTE — Progress Notes (Signed)
Primary Care Physician:  Erasmo DownerStrader, Lindsey F, NP  Primary GI: Dr. Darrick PennaFields   Patient Location: Home   [facility]   Reason for Visit: Follow-up   Persons present on the virtual encounter, with roles: Patient, NP   Total time (minutes) spent on medical discussion: 12 minutes   Due to COVID-19, visit was conducted using virtual method.  Visit was requested by patient.  Virtual Visit via Telephone Note Due to COVID-19, visit is conducted virtually and was requested by patient.   I connected with Madison Oliver on 07/01/18 at  2:30 PM EDT by telephone and verified that I am speaking with the correct person using two identifiers.   I discussed the limitations, risks, security and privacy concerns of performing an evaluation and management service by telephone and the availability of in person appointments. I also discussed with the patient that there may be a patient responsible charge related to this service. The patient expressed understanding and agreed to proceed.  Chief Complaint  Patient presents with  . Abdominal Pain  . Nausea    after eating  . Constipation    alt w/diarrrhea     History of Present Illness: 48 year old female with self-reportedhistory ofBarrett's esophagus, but EGD in 2016 at outside facility without evidence for this(Path without esophageal biopsies at that time)and most recent EGD in 2018 with benign-appearing esophageal stricture due to GERD s/p dilation, small hiatal hernia, mild gastritis/duodenitis due to Naproxen. No obviousevidence of Barrett's on exam. Redundant left colon, otherwise normal, external/internal hemorrhoids at time of colonoscopy.  Constipation: Linzess even at low dosages caused diarrhea. Amitiza 24 mcg once daily, which gives her diarrhea. Wants to try Amitiza 8 mcg BID again. Diarrhea off and on. Certain foods will make her have diarrhea: turnip greens, corn, ground beef, roast beef causes cramping.    Dysphagia: EGD 2018 s/p dilation, BPE normal motility, US thyroid with normal thyroid and small nodule without need for biopsy. can feel food going down sometimes.   Abdominal discomfort: Feels bloated when eating. Stomach swells and makes bra tight. NOT painful. HIDA scan with reproducible symptoms. Not eating as much but gaining weight.   Mother's day with UTI. Dexilant too expensive. Taking Prilosec but ran out.    Past Medical History:  Diagnosis Date  . Barrett esophagus   . Chronic pain   . Depression   . GERD (gastroesophageal reflux disease)   . HTN (hypertension)   . Hypercholesterolemia   . MS (multiple sclerosis) (HCC)   . RLS (restless legs syndrome)   . Sleep apnea    not using CPAP; cannot tolerate, PCP not aware.     Past Surgical History:  Procedure Laterality Date  . ABDOMINAL HYSTERECTOMY     still has ovaries  . BIOPSY  01/08/2017   Procedure: BIOPSY;  Surgeon: West BaliFields, Sandi L, MD;  [facility]: [facility];  Service: Endoscopy;;  gastric  . COLONOSCOPY WITH PROPOFOL N/A 01/08/2017   Redundant left colon, otherwise normal, external/internal hemorrhoids  . ESOPHAGOGASTRODUODENOSCOPY  2016   outside facility:  hiatal hernia, gastirtis, esophageal stricture. path with benign fragments of duodenum without pathological change.   . ESOPHAGOGASTRODUODENOSCOPY (EGD) WITH PROPOFOL N/A 01/08/2017   Benign-appearing esophageal stricture due to GERD s/p dilation, small hiatal hernia, mild gastritis/duodenitis due to Naproxen  . SAVORY DILATION N/A 01/08/2017   Procedure: SAVORY DILATION;  Surgeon: West BaliFields, Sandi L, MD;  [facility];  Service: Endoscopy;  Laterality: N/A;  . TONSILLECTOMY    .  TUBAL LIGATION       Current Meds  Medication Sig  . baclofen (LIORESAL) 20 MG tablet Take 20 mg by mouth 4 (four) times daily.  . carvedilol (COREG) 6.25 MG tablet Take 6.25 mg by mouth 2 (two) times daily with a meal.  . Cholecalciferol (VITAMIN D3) 2000 units TABS  Take 4,000 Units by mouth daily.  . citalopram (CELEXA) 40 MG tablet Take 40 mg by mouth daily.  . Dimethyl Fumarate (TECFIDERA) 240 MG CPDR Take 480 mg by mouth 2 (two) times daily.  . hydrochlorothiazide (HYDRODIURIL) 25 MG tablet Take 25 mg by mouth daily.  Marland Kitchen lidocaine (LIDODERM) 5 % Place 1 patch onto the skin daily as needed (for back pain.). Remove & Discard patch within 12 hours or as directed by MD  . losartan (COZAAR) 100 MG tablet Take 100 mg by mouth daily.  Marland Kitchen lubiprostone (AMITIZA) 24 MCG capsule Take 1 capsule (24 mcg total) by mouth 2 (two) times daily with a meal.  . mirabegron ER (MYRBETRIQ) 25 MG TB24 tablet Take 25 mg by mouth daily.  . naproxen (NAPROSYN) 500 MG tablet Take 500 mg by mouth 2 (two) times daily as needed (for pain.).   Marland Kitchen Polyethyl Glycol-Propyl Glycol (LUBRICANT EYE DROPS) 0.4-0.3 % SOLN Place 1-2 drops into both eyes 3 (three) times daily as needed (for dry eyes.).  Marland Kitchen Pregabalin ER (LYRICA CR) 165 MG TB24 Take 165 mg by mouth 3 (three) times daily.  Marland Kitchen zolpidem (AMBIEN) 10 MG tablet Take 10 mg by mouth at bedtime.      Family History  Problem Relation Age of Onset  . Colon polyps Mother 57  . Colon cancer Neg Hx     Social History   Socioeconomic History  . Marital status: Married    Spouse name: Not on file  . Number of children: Not on file  . Years of education: Not on file  . Highest education level: Not on file  Occupational History  . Occupation: disability  Social Needs  . Financial resource strain: Not on file  . Food insecurity:    Worry: Not on file    Inability: Not on file  . Transportation needs:    Medical: Not on file    Non-medical: Not on file  Tobacco Use  . Smoking status: Never Smoker  . Smokeless tobacco: Never Used  Substance and Sexual Activity  . Alcohol use: No  . Drug use: No  . Sexual activity: Yes    Birth control/protection: Surgical  Lifestyle  . Physical activity:    Days per week: Not on file    Minutes  per session: Not on file  . Stress: Not on file  Relationships  . Social connections:    Talks on phone: Not on file    Gets together: Not on file    Attends religious service: Not on file    Active member of club or organization: Not on file    Attends meetings of clubs or organizations: Not on file    Relationship status: Not on file  Other Topics Concern  . Not on file  Social History Narrative  . Not on file       Review of Systems: Gen: Denies fever, chills, anorexia. Denies fatigue, weakness, weight loss.  CV: Denies chest pain, palpitations, syncope, peripheral edema, and claudication. Resp: Denies dyspnea at rest, cough, wheezing, coughing up blood, and pleurisy. GI: see HPI Derm: Denies rash, itching, dry skin Psych: Denies depression, anxiety,  memory loss, confusion. No homicidal or suicidal ideation.  Heme: Denies bruising, bleeding, and enlarged lymph nodes.  Observations/Objective: No distress on video call. Pleasant and cooperative.   Assessment and Plan: 47 year old female with chronic GERD, gastritis, constipation, dysphagia for follow-up.  Constipation: decrease Amitiza to 8 mcg po BID.   GERD: stop Prilosec, as she is on Celexa. Start Protonix once daily. EGD recently on file in 2018 with benign-appearing esophageal stricture, s/p dilatation, mild gastritis/duodenitis.   Chronic abdominal discomfort: notes significant bloating after eating but denies outright pain. Previous evaluation with EGD, colonoscopy, addressing constipation but with persistent symptoms. US abdomen with fatty liver and no stones. HIDA scan NORMAL at 74%; however, she does note reproduction of symptoms at time of HIDA. I discussed with her that it is highly likely even with cholecystectomy, that she will continue to have these symptoms. She has been thoroughly evaluated from GI standpoint, and I doubt dealing with delayed gastric emptying at this time. She will have a consultation with Dr.  Lovell Sheehan to discuss candidacy of elective cholecystectomy, knowing she may continue to have symptoms despite this.   Follow-up in 4-6 months.   Follow Up Instructions:    I discussed the assessment and treatment plan with the patient. The patient was provided an opportunity to ask questions and all were answered. The patient agreed with the plan and demonstrated an understanding of the instructions.   The patient was advised to call back or seek an in-person evaluation if the symptoms worsen or if the condition fails to improve as anticipated.  I provided 12 minutes of face-to-face time during this video call encounter.   Gelene Mink, PhD, ANP-BC [facility]

## 2018-07-02 ENCOUNTER — Encounter: Payer: Self-pay | Admitting: Gastroenterology

## 2018-07-07 NOTE — Progress Notes (Signed)
CC'D TO PCP °

## 2018-07-10 DIAGNOSIS — E559 Vitamin D deficiency, unspecified: Secondary | ICD-10-CM | POA: Diagnosis not present

## 2018-07-10 DIAGNOSIS — E049 Nontoxic goiter, unspecified: Secondary | ICD-10-CM | POA: Diagnosis not present

## 2018-07-10 DIAGNOSIS — Z79899 Other long term (current) drug therapy: Secondary | ICD-10-CM | POA: Diagnosis not present

## 2018-07-10 DIAGNOSIS — E782 Mixed hyperlipidemia: Secondary | ICD-10-CM | POA: Diagnosis not present

## 2018-07-15 ENCOUNTER — Encounter: Payer: Self-pay | Admitting: General Surgery

## 2018-07-15 ENCOUNTER — Ambulatory Visit (INDEPENDENT_AMBULATORY_CARE_PROVIDER_SITE_OTHER): Payer: BC Managed Care – PPO | Admitting: General Surgery

## 2018-07-15 ENCOUNTER — Other Ambulatory Visit: Payer: Self-pay

## 2018-07-15 VITALS — BP 129/80 | HR 69 | Temp 98.6°F | Resp 16 | Ht 64.0 in | Wt 209.0 lb

## 2018-07-15 DIAGNOSIS — K811 Chronic cholecystitis: Secondary | ICD-10-CM | POA: Diagnosis not present

## 2018-07-15 NOTE — Progress Notes (Signed)
Madison Oliver; [id number]; 08/07/1970   HPI Patient is a 48-year-old black female who was referred to my care by Anna Boone of gastroenterology for evaluation treatment of biliary colic.  Patient has had a history of epigastric and right upper quadrant pain and bloating, especially after fatty meal for over a year.  It occurs frequently during the week.  The pain radiates around her right flank to her right shoulder.  She denies any vomiting.  She denies any fever, chills, or jaundice.  She currently has 0 out of 10 abdominal pain.  She did have an ultrasound of her gallbladder which was negative.  HIDA scan revealed a normal gallbladder ejection fraction but reproducible symptoms with a fatty meal. Past Medical History:  Diagnosis Date  . Barrett esophagus   . Chronic pain   . Depression   . GERD (gastroesophageal reflux disease)   . HTN (hypertension)   . Hypercholesterolemia   . MS (multiple sclerosis) (HCC)   . RLS (restless legs syndrome)   . Sleep apnea    not using CPAP; cannot tolerate, PCP not aware.    Past Surgical History:  Procedure Laterality Date  . ABDOMINAL HYSTERECTOMY     still has ovaries  . BIOPSY  01/08/2017   Procedure: BIOPSY;  Surgeon: Fields, Sandi L, MD;  [facility];  Service: Endoscopy;;  gastric  . COLONOSCOPY WITH PROPOFOL N/A 01/08/2017   Redundant left colon, otherwise normal, external/internal hemorrhoids  . ESOPHAGOGASTRODUODENOSCOPY  2016   outside facility:  hiatal hernia, gastirtis, esophageal stricture. path with benign fragments of duodenum without pathological change.   . ESOPHAGOGASTRODUODENOSCOPY (EGD) WITH PROPOFOL N/A 01/08/2017   Benign-appearing esophageal stricture due to GERD s/p dilation, small hiatal hernia, mild gastritis/duodenitis due to Naproxen  . SAVORY DILATION N/A 01/08/2017   Procedure: SAVORY DILATION;  Surgeon: Fields, Sandi L, MD;  [facility];  Service: Endoscopy;  Laterality: N/A;  . TONSILLECTOMY     . TUBAL LIGATION      Family History  Problem Relation Age of Onset  . Colon polyps Mother 50  . Colon cancer Neg Hx     Current Outpatient Medications on File Prior to Visit  Medication Sig Dispense Refill  . baclofen (LIORESAL) 20 MG tablet Take 20 mg by mouth 4 (four) times daily.    . carvedilol (COREG) 6.25 MG tablet Take 6.25 mg by mouth 2 (two) times daily with a meal.    . Cholecalciferol (VITAMIN D3) 2000 units TABS Take 4,000 Units by mouth daily.    . citalopram (CELEXA) 40 MG tablet Take 40 mg by mouth daily.    . Dimethyl Fumarate (TECFIDERA) 240 MG CPDR Take 480 mg by mouth 2 (two) times daily.    . hydrochlorothiazide (HYDRODIURIL) 25 MG tablet Take 25 mg by mouth daily.    . lidocaine (LIDODERM) 5 % Place 1 patch onto the skin daily as needed (for back pain.). Remove & Discard patch within 12 hours or as directed by MD    . losartan (COZAAR) 100 MG tablet Take 100 mg by mouth daily.    . lubiprostone (AMITIZA) 8 MCG capsule Take 1 capsule (8 mcg total) by mouth 2 (two) times daily with a meal. 90 capsule 3  . mirabegron ER (MYRBETRIQ) 25 MG TB24 tablet Take 25 mg by mouth daily.    . naproxen (NAPROSYN) 500 MG tablet Take 500 mg by mouth 2 (two) times daily as needed (for pain.).     .   pantoprazole (PROTONIX) 40 MG tablet Take 1 tablet (40 mg total) by mouth daily. 30 minutes before breakfast 90 tablet 3  . Polyethyl Glycol-Propyl Glycol (LUBRICANT EYE DROPS) 0.4-0.3 % SOLN Place 1-2 drops into both eyes 3 (three) times daily as needed (for dry eyes.).    . Pregabalin ER (LYRICA CR) 165 MG TB24 Take 165 mg by mouth 3 (three) times daily.    . zolpidem (AMBIEN) 10 MG tablet Take 10 mg by mouth at bedtime.      No current facility-administered medications on file prior to visit.     Allergies  Allergen Reactions  . Penicillins Other (See Comments)    Stomach aching Has patient had a PCN reaction causing immediate rash, facial/tongue/throat swelling, SOB or  lightheadedness with hypotension: No Has patient had a PCN reaction causing severe rash involving mucus membranes or skin necrosis: No Has patient had a PCN reaction that required hospitalization: No Has patient had a PCN reaction occurring within the last 10 years: No If all of the above answers are "NO", then may proceed with Cephalosporin use.   . Tizanidine Swelling    Swollen throat/difficulty swallowing.    Social History   Substance and Sexual Activity  Alcohol Use No    Social History   Tobacco Use  Smoking Status Never Smoker  Smokeless Tobacco Never Used    Review of Systems  Constitutional: Positive for malaise/fatigue.  HENT: Positive for sinus pain.   Eyes: Positive for blurred vision and double vision.  Respiratory: Positive for shortness of breath and wheezing.   Cardiovascular: Negative.   Gastrointestinal: Positive for abdominal pain, heartburn and nausea.  Genitourinary: Negative.   Musculoskeletal: Positive for back pain, joint pain and neck pain.  Skin: Negative.   Neurological: Positive for headaches.  Endo/Heme/Allergies: Negative.   Psychiatric/Behavioral: Negative.     Objective   Vitals:   07/15/18 1256  BP: 129/80  Pulse: 69  Resp: 16  Temp: 98.6 F (37 C)  SpO2: 98%    Physical Exam Vitals signs reviewed.  Constitutional:      Appearance: Normal appearance. She is not ill-appearing.  HENT:     Head: Normocephalic and atraumatic.  Eyes:     General: No scleral icterus. Cardiovascular:     Rate and Rhythm: Normal rate.     Heart sounds: Normal heart sounds. No murmur. No friction rub. No gallop.   Pulmonary:     Effort: Pulmonary effort is normal. No respiratory distress.     Breath sounds: Normal breath sounds. No stridor. No wheezing, rhonchi or rales.  Abdominal:     General: Abdomen is flat. Bowel sounds are normal. There is no distension.     Palpations: Abdomen is soft. There is no mass.     Tenderness: There is no  guarding or rebound.     Hernia: No hernia is present.  Skin:    General: Skin is warm and dry.  Neurological:     Mental Status: She is alert and oriented to person, place, and time.   GI notes reviewed  Assessment  Chronic cholecystitis Plan   Patient is scheduled for laparoscopic cholecystectomy on 07/30/2018.  The risks and benefits of the procedure including bleeding, infection, hepatobiliary injury, the possibility of recurrence of her symptoms, and the possibility of an open procedure were fully explained to the patient, who gave informed consent.  

## 2018-07-15 NOTE — Patient Instructions (Signed)
Laparoscopic Cholecystectomy Laparoscopic cholecystectomy is surgery to remove the gallbladder. The gallbladder is a pear-shaped organ that lies beneath the liver on the right side of the body. The gallbladder stores bile, which is a fluid that helps the body to digest fats. Cholecystectomy is often done for inflammation of the gallbladder (cholecystitis). This condition is usually caused by a buildup of gallstones (cholelithiasis) in the gallbladder. Gallstones can block the flow of bile, which can result in inflammation and pain. In severe cases, emergency surgery may be required. This procedure is done though small incisions in your abdomen (laparoscopic surgery). A thin scope with a camera (laparoscope) is inserted through one incision. Thin surgical instruments are inserted through the other incisions. In some cases, a laparoscopic procedure may be turned into a type of surgery that is done through a larger incision (open surgery). Tell a health care provider about:  Any allergies you have.  All medicines you are taking, including vitamins, herbs, eye drops, creams, and over-the-counter medicines.  Any problems you or family members have had with anesthetic medicines.  Any blood disorders you have.  Any surgeries you have had.  Any medical conditions you have.  Whether you are pregnant or may be pregnant. What are the risks? Generally, this is a safe procedure. However, problems may occur, including:  Infection.  Bleeding.  Allergic reactions to medicines.  Damage to other structures or organs.  A stone remaining in the common bile duct. The common bile duct carries bile from the gallbladder into the small intestine.  A bile leak from the cyst duct that is clipped when your gallbladder is removed. What happens before the procedure?   Medicines  Ask your health care provider about: ? Changing or stopping your regular medicines. This is especially important if you are taking  diabetes medicines or blood thinners. ? Taking medicines such as aspirin and ibuprofen. These medicines can thin your blood. Do not take these medicines before your procedure if your health care provider instructs you not to.  You may be given antibiotic medicine to help prevent infection. General instructions  Let your health care provider know if you develop a cold or an infection before surgery.  Plan to have someone take you home from the hospital or clinic.  Ask your health care provider how your surgical site will be marked or identified. What happens during the procedure?   To reduce your risk of infection: ? Your health care team will wash or sanitize their hands. ? Your skin will be washed with soap. ? Hair may be removed from the surgical area.  An IV tube may be inserted into one of your veins.  You will be given one or more of the following: ? A medicine to help you relax (sedative). ? A medicine to make you fall asleep (general anesthetic).  A breathing tube will be placed in your mouth.  Your surgeon will make several small cuts (incisions) in your abdomen.  The laparoscope will be inserted through one of the small incisions. The camera on the laparoscope will send images to a TV screen (monitor) in the operating room. This lets your surgeon see inside your abdomen.  Air-like gas will be pumped into your abdomen. This will expand your abdomen to give the surgeon more room to perform the surgery.  Other tools that are needed for the procedure will be inserted through the other incisions. The gallbladder will be removed through one of the incisions.  Your common bile duct   may be examined. If stones are found in the common bile duct, they may be removed.  After your gallbladder has been removed, the incisions will be closed with stitches (sutures), staples, or skin glue.  Your incisions may be covered with a bandage (dressing). The procedure may vary among health  care providers and hospitals. What happens after the procedure?  Your blood pressure, heart rate, breathing rate, and blood oxygen level will be monitored until the medicines you were given have worn off.  You will be given medicines as needed to control your pain.  Do not drive for 24 hours if you were given a sedative. This information is not intended to replace advice given to you by your health care provider. Make sure you discuss any questions you have with your health care provider. Document Released: 01/22/2005 Document Revised: 12/20/2016 Document Reviewed: 07/11/2015 Elsevier Interactive Patient Education  2019 Elsevier Inc.  

## 2018-07-15 NOTE — H&P (Signed)
Madison Oliver; [id number]; 1971-01-21   HPI Patient is a 48 year old black female who was referred to my care by Lewie LoronAnna Boone of gastroenterology for evaluation treatment of biliary colic.  Patient has had a history of epigastric and right upper quadrant pain and bloating, especially after fatty meal for over a year.  It occurs frequently during the week.  The pain radiates around her right flank to her right shoulder.  She denies any vomiting.  She denies any fever, chills, or jaundice.  She currently has 0 out of 10 abdominal pain.  She did have an ultrasound of her gallbladder which was negative.  HIDA scan revealed a normal gallbladder ejection fraction but reproducible symptoms with a fatty meal. Past Medical History:  Diagnosis Date  . Barrett esophagus   . Chronic pain   . Depression   . GERD (gastroesophageal reflux disease)   . HTN (hypertension)   . Hypercholesterolemia   . MS (multiple sclerosis) (HCC)   . RLS (restless legs syndrome)   . Sleep apnea    not using CPAP; cannot tolerate, PCP not aware.    Past Surgical History:  Procedure Laterality Date  . ABDOMINAL HYSTERECTOMY     still has ovaries  . BIOPSY  01/08/2017   Procedure: BIOPSY;  Surgeon: West BaliFields, Sandi L, MD;  Location: [facility];  Service: Endoscopy;;  gastric  . COLONOSCOPY WITH PROPOFOL N/A 01/08/2017   Redundant left colon, otherwise normal, external/internal hemorrhoids  . ESOPHAGOGASTRODUODENOSCOPY  2016   outside facility:  hiatal hernia, gastirtis, esophageal stricture. path with benign fragments of duodenum without pathological change.   . ESOPHAGOGASTRODUODENOSCOPY (EGD) WITH PROPOFOL N/A 01/08/2017   Benign-appearing esophageal stricture due to GERD s/p dilation, small hiatal hernia, mild gastritis/duodenitis due to Naproxen  . SAVORY DILATION N/A 01/08/2017   Procedure: SAVORY DILATION;  Surgeon: West BaliFields, Sandi L, MD;  Location: [facility];  Service: Endoscopy;  Laterality: N/A;  . TONSILLECTOMY     . TUBAL LIGATION      Family History  Problem Relation Age of Onset  . Colon polyps Mother 5150  . Colon cancer Neg Hx     Current Outpatient Medications on File Prior to Visit  Medication Sig Dispense Refill  . baclofen (LIORESAL) 20 MG tablet Take 20 mg by mouth 4 (four) times daily.    . carvedilol (COREG) 6.25 MG tablet Take 6.25 mg by mouth 2 (two) times daily with a meal.    . Cholecalciferol (VITAMIN D3) 2000 units TABS Take 4,000 Units by mouth daily.    . citalopram (CELEXA) 40 MG tablet Take 40 mg by mouth daily.    . Dimethyl Fumarate (TECFIDERA) 240 MG CPDR Take 480 mg by mouth 2 (two) times daily.    . hydrochlorothiazide (HYDRODIURIL) 25 MG tablet Take 25 mg by mouth daily.    Marland Kitchen. lidocaine (LIDODERM) 5 % Place 1 patch onto the skin daily as needed (for back pain.). Remove & Discard patch within 12 hours or as directed by MD    . losartan (COZAAR) 100 MG tablet Take 100 mg by mouth daily.    Marland Kitchen. lubiprostone (AMITIZA) 8 MCG capsule Take 1 capsule (8 mcg total) by mouth 2 (two) times daily with a meal. 90 capsule 3  . mirabegron ER (MYRBETRIQ) 25 MG TB24 tablet Take 25 mg by mouth daily.    . naproxen (NAPROSYN) 500 MG tablet Take 500 mg by mouth 2 (two) times daily as needed (for pain.).     .Marland Kitchen  pantoprazole (PROTONIX) 40 MG tablet Take 1 tablet (40 mg total) by mouth daily. 30 minutes before breakfast 90 tablet 3  . Polyethyl Glycol-Propyl Glycol (LUBRICANT EYE DROPS) 0.4-0.3 % SOLN Place 1-2 drops into both eyes 3 (three) times daily as needed (for dry eyes.).    Marland Kitchen Pregabalin ER (LYRICA CR) 165 MG TB24 Take 165 mg by mouth 3 (three) times daily.    Marland Kitchen zolpidem (AMBIEN) 10 MG tablet Take 10 mg by mouth at bedtime.      No current facility-administered medications on file prior to visit.     Allergies  Allergen Reactions  . Penicillins Other (See Comments)    Stomach aching Has patient had a PCN reaction causing immediate rash, facial/tongue/throat swelling, SOB or  lightheadedness with hypotension: No Has patient had a PCN reaction causing severe rash involving mucus membranes or skin necrosis: No Has patient had a PCN reaction that required hospitalization: No Has patient had a PCN reaction occurring within the last 10 years: No If all of the above answers are "NO", then may proceed with Cephalosporin use.   . Tizanidine Swelling    Swollen throat/difficulty swallowing.    Social History   Substance and Sexual Activity  Alcohol Use No    Social History   Tobacco Use  Smoking Status Never Smoker  Smokeless Tobacco Never Used    Review of Systems  Constitutional: Positive for malaise/fatigue.  HENT: Positive for sinus pain.   Eyes: Positive for blurred vision and double vision.  Respiratory: Positive for shortness of breath and wheezing.   Cardiovascular: Negative.   Gastrointestinal: Positive for abdominal pain, heartburn and nausea.  Genitourinary: Negative.   Musculoskeletal: Positive for back pain, joint pain and neck pain.  Skin: Negative.   Neurological: Positive for headaches.  Endo/Heme/Allergies: Negative.   Psychiatric/Behavioral: Negative.     Objective   Vitals:   07/15/18 1256  BP: 129/80  Pulse: 69  Resp: 16  Temp: 98.6 F (37 C)  SpO2: 98%    Physical Exam Vitals signs reviewed.  Constitutional:      Appearance: Normal appearance. She is not ill-appearing.  HENT:     Head: Normocephalic and atraumatic.  Eyes:     General: No scleral icterus. Cardiovascular:     Rate and Rhythm: Normal rate.     Heart sounds: Normal heart sounds. No murmur. No friction rub. No gallop.   Pulmonary:     Effort: Pulmonary effort is normal. No respiratory distress.     Breath sounds: Normal breath sounds. No stridor. No wheezing, rhonchi or rales.  Abdominal:     General: Abdomen is flat. Bowel sounds are normal. There is no distension.     Palpations: Abdomen is soft. There is no mass.     Tenderness: There is no  guarding or rebound.     Hernia: No hernia is present.  Skin:    General: Skin is warm and dry.  Neurological:     Mental Status: She is alert and oriented to person, place, and time.   GI notes reviewed  Assessment  Chronic cholecystitis Plan   Patient is scheduled for laparoscopic cholecystectomy on 07/30/2018.  The risks and benefits of the procedure including bleeding, infection, hepatobiliary injury, the possibility of recurrence of her symptoms, and the possibility of an open procedure were fully explained to the patient, who gave informed consent.

## 2018-07-25 ENCOUNTER — Other Ambulatory Visit (HOSPITAL_COMMUNITY): Payer: BC Managed Care – PPO

## 2018-08-01 NOTE — Patient Instructions (Signed)
Your procedure is scheduled on: 08/13/2018  Report to [facility] at   6:15  AM.  Call this number if you have problems the morning of surgery: [phone]   Remember:   Do not Eat or Drink after midnight   :  Take these medicines the morning of surgery with A SIP OF WATER: Coreg, Celexa, Losartan and Protonix   Do not wear jewelry, make-up or nail polish.  Do not wear lotions, powders, or perfumes. You may wear deodorant.  Do not shave 48 hours prior to surgery. Men may shave face and neck.  Do not bring valuables to the hospital.  Contacts, dentures or bridgework may not be worn into surgery.  Leave suitcase in the car. After surgery it may be brought to your room.  For patients admitted to the hospital, checkout time is 11:00 AM the day of discharge.   Patients discharged the day of surgery will not be allowed to drive home.    Special Instructions: Shower using CHG night before surgery and shower the day of surgery use CHG.  Use special wash - you have one bottle of CHG for all showers.  You should use approximately 1/2 of the bottle for each shower.  Laparoscopic Cholecystectomy, Care After This sheet gives you information about how to care for yourself after your procedure. Your health care provider may also give you more specific instructions. If you have problems or questions, contact your health care provider. What can I expect after the procedure? After the procedure, it is common to have:  Pain at your incision sites. You will be given medicines to control this pain.  Mild nausea or vomiting.  Bloating and possible shoulder pain from the air-like gas that was used during the procedure. Follow these instructions at home: Incision care   Follow instructions from your health care provider about how to take care of your incisions. Make sure you: ? Wash your hands with soap and water before you change your bandage (dressing). If soap and water are not available, use hand  sanitizer. ? Change your dressing as told by your health care provider. ? Leave stitches (sutures), skin glue, or adhesive strips in place. These skin closures may need to be in place for 2 weeks or longer. If adhesive strip edges start to loosen and curl up, you may trim the loose edges. Do not remove adhesive strips completely unless your health care provider tells you to do that.  Do not take baths, swim, or use a hot tub until your health care provider approves. Ask your health care provider if you can take showers. You may only be allowed to take sponge baths for bathing.  Check your incision area every day for signs of infection. Check for: ? More redness, swelling, or pain. ? More fluid or blood. ? Warmth. ? Pus or a bad smell. Activity  Do not drive or use heavy machinery while taking prescription pain medicine.  Do not lift anything that is heavier than 10 lb (4.5 kg) until your health care provider approves.  Do not play contact sports until your health care provider approves.  Do not drive for 24 hours if you were given a medicine to help you relax (sedative).  Rest as needed. Do not return to work or school until your health care provider approves. General instructions  Take over-the-counter and prescription medicines only as told by your health care provider.  To prevent or treat constipation while you are taking prescription  pain medicine, your health care provider may recommend that you: ? Drink enough fluid to keep your urine clear or pale yellow. ? Take over-the-counter or prescription medicines. ? Eat foods that are high in fiber, such as fresh fruits and vegetables, whole grains, and beans. ? Limit foods that are high in fat and processed sugars, such as fried and sweet foods. Contact a health care provider if:  You develop a rash.  You have more redness, swelling, or pain around your incisions.  You have more fluid or blood coming from your incisions.  Your  incisions feel warm to the touch.  You have pus or a bad smell coming from your incisions.  You have a fever.  One or more of your incisions breaks open. Get help right away if:  You have trouble breathing.  You have chest pain.  You have increasing pain in your shoulders.  You faint or feel dizzy when you stand.  You have severe pain in your abdomen.  You have nausea or vomiting that lasts for more than one day.  You have leg pain. This information is not intended to replace advice given to you by your health care provider. Make sure you discuss any questions you have with your health care provider. Document Released: 01/22/2005 Document Revised: 01/04/2017 Document Reviewed: 07/11/2015 Elsevier Patient Education  2020 Elsevier Inc.  General Anesthesia, Adult, Care After This sheet gives you information about how to care for yourself after your procedure. Your health care provider may also give you more specific instructions. If you have problems or questions, contact your health care provider. What can I expect after the procedure? After the procedure, the following side effects are common:  Pain or discomfort at the IV site.  Nausea.  Vomiting.  Sore throat.  Trouble concentrating.  Feeling cold or chills.  Weak or tired.  Sleepiness and fatigue.  Soreness and body aches. These side effects can affect parts of the body that were not involved in surgery. Follow these instructions at home:  For at least 24 hours after the procedure:  Have a responsible adult stay with you. It is important to have someone help care for you until you are awake and alert.  Rest as needed.  Do not: ? Participate in activities in which you could fall or become injured. ? Drive. ? Use heavy machinery. ? Drink alcohol. ? Take sleeping pills or medicines that cause drowsiness. ? Make important decisions or sign legal documents. ? Take care of children on your own. Eating and  drinking  Follow any instructions from your health care provider about eating or drinking restrictions.  When you feel hungry, start by eating small amounts of foods that are soft and easy to digest (bland), such as toast. Gradually return to your regular diet.  Drink enough fluid to keep your urine pale yellow.  If you vomit, rehydrate by drinking water, juice, or clear broth. General instructions  If you have sleep apnea, surgery and certain medicines can increase your risk for breathing problems. Follow instructions from your health care provider about wearing your sleep device: ? Anytime you are sleeping, including during daytime naps. ? While taking prescription pain medicines, sleeping medicines, or medicines that make you drowsy.  Return to your normal activities as told by your health care provider. Ask your health care provider what activities are safe for you.  Take over-the-counter and prescription medicines only as told by your health care provider.  If you smoke, do  not smoke without supervision.  Keep all follow-up visits as told by your health care provider. This is important. Contact a health care provider if:  You have nausea or vomiting that does not get better with medicine.  You cannot eat or drink without vomiting.  You have pain that does not get better with medicine.  You are unable to pass urine.  You develop a skin rash.  You have a fever.  You have redness around your IV site that gets worse. Get help right away if:  You have difficulty breathing.  You have chest pain.  You have blood in your urine or stool, or you vomit blood. Summary  After the procedure, it is common to have a sore throat or nausea. It is also common to feel tired.  Have a responsible adult stay with you for the first 24 hours after general anesthesia. It is important to have someone help care for you until you are awake and alert.  When you feel hungry, start by eating  small amounts of foods that are soft and easy to digest (bland), such as toast. Gradually return to your regular diet.  Drink enough fluid to keep your urine pale yellow.  Return to your normal activities as told by your health care provider. Ask your health care provider what activities are safe for you. This information is not intended to replace advice given to you by your health care provider. Make sure you discuss any questions you have with your health care provider. Document Released: 04/30/2000 Document Revised: 01/25/2017 Document Reviewed: 09/07/2016 Elsevier Patient Education  2020 [employer].

## 2018-08-07 ENCOUNTER — Ambulatory Visit (HOSPITAL_COMMUNITY)
Admission: RE | Admit: 2018-08-07 | Discharge: 2018-08-07 | Disposition: A | Discharge status: Home / Self Care | Payer: BC Managed Care – PPO | Source: Ambulatory Visit | Attending: General Surgery | Admitting: General Surgery

## 2018-08-07 ENCOUNTER — Other Ambulatory Visit: Payer: Self-pay

## 2018-08-07 ENCOUNTER — Other Ambulatory Visit (HOSPITAL_COMMUNITY)
Admission: RE | Admit: 2018-08-07 | Discharge: 2018-08-07 | Disposition: A | Discharge status: Home / Self Care | Payer: BC Managed Care – PPO | Source: Ambulatory Visit | Attending: General Surgery | Admitting: General Surgery

## 2018-08-07 DIAGNOSIS — Z01818 Encounter for other preprocedural examination: Secondary | ICD-10-CM | POA: Diagnosis not present

## 2018-08-07 DIAGNOSIS — K811 Chronic cholecystitis: Secondary | ICD-10-CM | POA: Diagnosis not present

## 2018-08-07 DIAGNOSIS — R001 Bradycardia, unspecified: Secondary | ICD-10-CM | POA: Diagnosis not present

## 2018-08-07 DIAGNOSIS — Z1159 Encounter for screening for other viral diseases: Secondary | ICD-10-CM | POA: Insufficient documentation

## 2018-08-07 LAB — COMPREHENSIVE METABOLIC PANEL
ALT: 22 U/L (ref 0–44)
AST: 22 U/L (ref 15–41)
Albumin: 4.1 g/dL (ref 3.5–5.0)
Alkaline Phosphatase: 59 U/L (ref 38–126)
Anion gap: 15 (ref 5–15)
BUN: 12 mg/dL (ref 6–20)
CO2: 23 mmol/L (ref 22–32)
Calcium: 8.9 mg/dL (ref 8.9–10.3)
Chloride: 101 mmol/L (ref 98–111)
Creatinine, Ser: 0.52 mg/dL (ref 0.44–1.00)
GFR calc Af Amer: >60 mL/min (ref >60)
GFR calc non Af Amer: >60 mL/min (ref >60)
Glucose, Bld: 110 mg/dL — ABNORMAL HIGH (ref 70–99)
Potassium: 3.5 mmol/L (ref 3.5–5.1)
Sodium: 139 mmol/L (ref 135–145)
Total Bilirubin: 0.3 mg/dL (ref 0.3–1.2)
Total Protein: 7.2 g/dL (ref 6.5–8.1)

## 2018-08-07 LAB — CBC WITH DIFFERENTIAL/PLATELET
Abs Immature Granulocytes: 0.02 10*3/uL (ref 0.00–0.07)
Basophils Absolute: 0.1 10*3/uL (ref 0.0–0.1)
Basophils Relative: 1 %
Eosinophils Absolute: 0.4 10*3/uL (ref 0.0–0.5)
Eosinophils Relative: 6 %
HCT: 36.7 % (ref 36.0–46.0)
Hemoglobin: 11.5 g/dL — ABNORMAL LOW (ref 12.0–15.0)
Immature Granulocytes: 0 %
Lymphocytes Relative: 43 %
Lymphs Abs: 3 10*3/uL (ref 0.7–4.0)
MCH: 27.3 pg (ref 26.0–34.0)
MCHC: 31.3 g/dL (ref 30.0–36.0)
MCV: 87 fL (ref 80.0–100.0)
Monocytes Absolute: 0.4 10*3/uL (ref 0.1–1.0)
Monocytes Relative: 6 %
Neutro Abs: 3 10*3/uL (ref 1.7–7.7)
Neutrophils Relative %: 44 %
Platelets: 319 10*3/uL (ref 150–400)
RBC: 4.22 MIL/uL (ref 3.87–5.11)
RDW: 13.7 % (ref 11.5–15.5)
WBC: 6.8 10*3/uL (ref 4.0–10.5)
nRBC: 0 % (ref 0.0–0.2)

## 2018-08-07 LAB — SARS CORONAVIRUS 2 (TAT 6-24 HRS): SARS Coronavirus 2: NEGATIVE

## 2018-08-13 ENCOUNTER — Other Ambulatory Visit: Payer: Self-pay

## 2018-08-13 ENCOUNTER — Ambulatory Visit (HOSPITAL_COMMUNITY): Payer: BC Managed Care – PPO | Admitting: Anesthesiology

## 2018-08-13 ENCOUNTER — Encounter (HOSPITAL_COMMUNITY): Payer: Self-pay | Admitting: Anesthesiology

## 2018-08-13 ENCOUNTER — Ambulatory Visit (HOSPITAL_COMMUNITY)
Admission: RE | Admit: 2018-08-13 | Discharge: 2018-08-13 | Disposition: A | Discharge status: Home / Self Care | Payer: BC Managed Care – PPO | Attending: General Surgery | Admitting: General Surgery

## 2018-08-13 ENCOUNTER — Encounter (HOSPITAL_COMMUNITY)
Admission: RE | Disposition: A | Discharge status: Home / Self Care | Payer: Self-pay | Source: Home / Self Care | Attending: General Surgery

## 2018-08-13 DIAGNOSIS — Z79899 Other long term (current) drug therapy: Secondary | ICD-10-CM | POA: Diagnosis not present

## 2018-08-13 DIAGNOSIS — K811 Chronic cholecystitis: Secondary | ICD-10-CM | POA: Diagnosis not present

## 2018-08-13 DIAGNOSIS — G8929 Other chronic pain: Secondary | ICD-10-CM | POA: Diagnosis not present

## 2018-08-13 DIAGNOSIS — F329 Major depressive disorder, single episode, unspecified: Secondary | ICD-10-CM | POA: Diagnosis not present

## 2018-08-13 DIAGNOSIS — Z880 Allergy status to penicillin: Secondary | ICD-10-CM | POA: Diagnosis not present

## 2018-08-13 DIAGNOSIS — I10 Essential (primary) hypertension: Secondary | ICD-10-CM | POA: Diagnosis not present

## 2018-08-13 DIAGNOSIS — Z888 Allergy status to other drugs, medicaments and biological substances status: Secondary | ICD-10-CM | POA: Diagnosis not present

## 2018-08-13 DIAGNOSIS — G35 Multiple sclerosis: Secondary | ICD-10-CM | POA: Diagnosis not present

## 2018-08-13 DIAGNOSIS — K219 Gastro-esophageal reflux disease without esophagitis: Secondary | ICD-10-CM | POA: Diagnosis not present

## 2018-08-13 DIAGNOSIS — G4730 Sleep apnea, unspecified: Secondary | ICD-10-CM | POA: Diagnosis not present

## 2018-08-13 HISTORY — PX: CHOLECYSTECTOMY: SHX55

## 2018-08-13 SURGERY — LAPAROSCOPIC CHOLECYSTECTOMY
Anesthesia: General

## 2018-08-13 MED ORDER — HYDROMORPHONE HCL 1 MG/ML IJ SOLN: 0.2500 mg | INTRAMUSCULAR | Status: DC | PRN

## 2018-08-13 MED ORDER — ONDANSETRON HCL 4 MG/2ML IJ SOLN
INTRAMUSCULAR | Status: AC
  Filled 2018-08-13: qty 2

## 2018-08-13 MED ORDER — ONDANSETRON HCL 4 MG/2ML IJ SOLN
INTRAMUSCULAR | Status: DC | PRN
  Administered 2018-08-13: 4 mg via INTRAVENOUS

## 2018-08-13 MED ORDER — KETOROLAC TROMETHAMINE 30 MG/ML IJ SOLN
30.0000 mg | Freq: Once | INTRAMUSCULAR | Status: AC
  Administered 2018-08-13: 30 mg via INTRAVENOUS
  Filled 2018-08-13: qty 1

## 2018-08-13 MED ORDER — BUPIVACAINE LIPOSOME 1.3 % IJ SUSP
INTRAMUSCULAR | Status: AC
  Filled 2018-08-13: qty 20

## 2018-08-13 MED ORDER — HYDROCODONE-ACETAMINOPHEN 7.5-325 MG PO TABS
1.0000 | ORAL_TABLET | Freq: Once | ORAL | Status: AC | PRN
  Administered 2018-08-13: 1 via ORAL
  Filled 2018-08-13: qty 1

## 2018-08-13 MED ORDER — PROPOFOL 10 MG/ML IV BOLUS
INTRAVENOUS | Status: DC | PRN
  Administered 2018-08-13: 180 mg via INTRAVENOUS

## 2018-08-13 MED ORDER — SODIUM CHLORIDE 0.9 % IR SOLN
Status: DC | PRN
  Administered 2018-08-13: 1000 mL

## 2018-08-13 MED ORDER — HYDROCODONE-ACETAMINOPHEN 5-325 MG PO TABS: 1.0000 | ORAL_TABLET | ORAL | 0 refills | Status: DC | PRN

## 2018-08-13 MED ORDER — CIPROFLOXACIN IN D5W 400 MG/200ML IV SOLN
400.0000 mg | INTRAVENOUS | Status: AC
  Administered 2018-08-13: 400 mg via INTRAVENOUS

## 2018-08-13 MED ORDER — PROMETHAZINE HCL 25 MG/ML IJ SOLN: 6.2500 mg | INTRAMUSCULAR | Status: DC | PRN

## 2018-08-13 MED ORDER — CIPROFLOXACIN IN D5W 400 MG/200ML IV SOLN
INTRAVENOUS | Status: AC
  Filled 2018-08-13: qty 200

## 2018-08-13 MED ORDER — LACTATED RINGERS IV SOLN
INTRAVENOUS | Status: DC
  Administered 2018-08-13: 1000 mL via INTRAVENOUS

## 2018-08-13 MED ORDER — FENTANYL CITRATE (PF) 100 MCG/2ML IJ SOLN
INTRAMUSCULAR | Status: DC | PRN
  Administered 2018-08-13: 100 ug via INTRAVENOUS
  Administered 2018-08-13 (×2): 50 ug via INTRAVENOUS

## 2018-08-13 MED ORDER — SUCCINYLCHOLINE CHLORIDE 20 MG/ML IJ SOLN
INTRAMUSCULAR | Status: DC | PRN
  Administered 2018-08-13: 180 mg via INTRAVENOUS

## 2018-08-13 MED ORDER — MIDAZOLAM HCL 2 MG/2ML IJ SOLN: 0.5000 mg | Freq: Once | INTRAMUSCULAR | Status: DC | PRN

## 2018-08-13 MED ORDER — CHLORHEXIDINE GLUCONATE CLOTH 2 % EX PADS: 6.0000 | MEDICATED_PAD | Freq: Once | CUTANEOUS | Status: DC

## 2018-08-13 MED ORDER — LIDOCAINE 2% (20 MG/ML) 5 ML SYRINGE
INTRAMUSCULAR | Status: DC | PRN
  Administered 2018-08-13: 40 mg via INTRAVENOUS

## 2018-08-13 MED ORDER — BUPIVACAINE LIPOSOME 1.3 % IJ SUSP
INTRAMUSCULAR | Status: DC | PRN
  Administered 2018-08-13: 20 mL

## 2018-08-13 MED ORDER — PROPOFOL 10 MG/ML IV BOLUS
INTRAVENOUS | Status: AC
  Filled 2018-08-13: qty 40

## 2018-08-13 MED ORDER — ARTIFICIAL TEARS OPHTHALMIC OINT
TOPICAL_OINTMENT | OPHTHALMIC | Status: AC
  Filled 2018-08-13: qty 3.5

## 2018-08-13 MED ORDER — HEMOSTATIC AGENTS (NO CHARGE) OPTIME
TOPICAL | Status: DC | PRN
  Administered 2018-08-13: 1 via TOPICAL

## 2018-08-13 MED ORDER — ROCURONIUM BROMIDE 10 MG/ML (PF) SYRINGE
PREFILLED_SYRINGE | INTRAVENOUS | Status: AC
  Filled 2018-08-13: qty 20

## 2018-08-13 MED ORDER — FENTANYL CITRATE (PF) 250 MCG/5ML IJ SOLN
INTRAMUSCULAR | Status: AC
  Filled 2018-08-13: qty 5

## 2018-08-13 MED ORDER — SUGAMMADEX SODIUM 500 MG/5ML IV SOLN
INTRAVENOUS | Status: DC | PRN
  Administered 2018-08-13: 187.8 mg via INTRAVENOUS

## 2018-08-13 MED ORDER — GLYCOPYRROLATE PF 0.2 MG/ML IJ SOSY
PREFILLED_SYRINGE | INTRAMUSCULAR | Status: DC | PRN
  Administered 2018-08-13: .2 mg via INTRAVENOUS

## 2018-08-13 MED ORDER — SUCCINYLCHOLINE CHLORIDE 200 MG/10ML IV SOSY
PREFILLED_SYRINGE | INTRAVENOUS | Status: AC
  Filled 2018-08-13: qty 20

## 2018-08-13 SURGICAL SUPPLY — 44 items
BAG RETRIEVAL 10 (BASKET) ×1
CHLORAPREP W/TINT 26 (MISCELLANEOUS) ×2 IMPLANT
CLOTH BEACON ORANGE TIMEOUT ST (SAFETY) ×2 IMPLANT
COVER LIGHT HANDLE STERIS (MISCELLANEOUS) ×4 IMPLANT
COVER WAND RF STERILE (DRAPES) ×2 IMPLANT
DERMABOND ADVANCED (GAUZE/BANDAGES/DRESSINGS) ×1
DERMABOND ADVANCED .7 DNX12 (GAUZE/BANDAGES/DRESSINGS) ×1 IMPLANT
ELECT REM PT RETURN 9FT ADLT (ELECTROSURGICAL) ×2
ELECTRODE REM PT RTRN 9FT ADLT (ELECTROSURGICAL) ×1 IMPLANT
FILTER SMOKE EVAC LAPAROSHD (FILTER) ×2 IMPLANT
GLOVE BIO SURGEON STRL SZ7 (GLOVE) ×1 IMPLANT
GLOVE BIOGEL PI IND STRL 7.0 (GLOVE) ×1 IMPLANT
GLOVE BIOGEL PI INDICATOR 7.0 (GLOVE) ×3
GLOVE ECLIPSE 6.5 STRL STRAW (GLOVE) ×1 IMPLANT
GLOVE SURG SS PI 7.5 STRL IVOR (GLOVE) ×2 IMPLANT
GOWN STRL REUS W/ TWL LRG LVL3 (GOWN DISPOSABLE) IMPLANT
GOWN STRL REUS W/TWL LRG LVL3 (GOWN DISPOSABLE) ×6 IMPLANT
HEMOSTAT SNOW SURGICEL 2X4 (HEMOSTASIS) ×2 IMPLANT
INST SET LAPROSCOPIC AP (KITS) ×2 IMPLANT
KIT TURNOVER KIT A (KITS) ×2 IMPLANT
MANIFOLD NEPTUNE II (INSTRUMENTS) ×2 IMPLANT
NDL HYPO 18GX1.5 BLUNT FILL (NEEDLE) ×1 IMPLANT
NDL INSUFFLATION 14GA 120MM (NEEDLE) ×1 IMPLANT
NEEDLE HYPO 18GX1.5 BLUNT FILL (NEEDLE) ×2 IMPLANT
NEEDLE HYPO 22GX1.5 SAFETY (NEEDLE) ×2 IMPLANT
NEEDLE INSUFFLATION 14GA 120MM (NEEDLE) ×2 IMPLANT
NS IRRIG 1000ML POUR BTL (IV SOLUTION) ×2 IMPLANT
PACK LAP CHOLE LZT030E (CUSTOM PROCEDURE TRAY) ×2 IMPLANT
PAD ARMBOARD 7.5X6 YLW CONV (MISCELLANEOUS) ×2 IMPLANT
SET BASIN LINEN APH (SET/KITS/TRAYS/PACK) ×2 IMPLANT
SET TUBE IRRIG SUCTION NO TIP (IRRIGATION / IRRIGATOR) IMPLANT
SLEEVE ENDOPATH XCEL 5M (ENDOMECHANICALS) ×2 IMPLANT
SUT MNCRL AB 4-0 PS2 18 (SUTURE) ×3 IMPLANT
SUT VICRYL 0 UR6 27IN ABS (SUTURE) ×2 IMPLANT
SYR 20CC LL (SYRINGE) ×2 IMPLANT
SYR 30ML LL (SYRINGE) ×1 IMPLANT
SYS BAG RETRIEVAL 10MM (BASKET) ×1
SYSTEM BAG RETRIEVAL 10MM (BASKET) ×1 IMPLANT
TROCAR ENDO BLADELESS 11MM (ENDOMECHANICALS) ×2 IMPLANT
TROCAR XCEL NON-BLD 5MMX100MML (ENDOMECHANICALS) ×2 IMPLANT
TROCAR XCEL UNIV SLVE 11M 100M (ENDOMECHANICALS) ×2 IMPLANT
TUBE CONNECTING 12X1/4 (SUCTIONS) ×2 IMPLANT
TUBING INSUFFLATION (TUBING) ×2 IMPLANT
WARMER LAPAROSCOPE (MISCELLANEOUS) ×2 IMPLANT

## 2018-08-13 NOTE — Op Note (Signed)
Patient:  Madison Oliver  DOB:  02/18/1970  MRN:  [id number]   Preop Diagnosis: Chronic cholecystitis  Postop Diagnosis: Same  Procedure: Laparoscopic cholecystectomy  Surgeon: Aviva Signs, MD  Anes: General endotracheal  Indications: Patient is a 48 year old black female who was referred to my care for evaluation treatment of chronic cholecystitis.  The risks and benefits of the procedure including bleeding, infection, hepatobiliary injury, and the possibility of an open procedure were fully explained to the patient, who gave informed consent.  Procedure note: The patient was placed in the supine position.  After induction of general endotracheal anesthesia, the abdomen was prepped and draped using the usual sterile technique with ChloraPrep.  Surgical site confirmation was performed.  A supraumbilical incision was made down to the fascia.  A Veress needle was introduced into the abdominal cavity and confirmation of placement was done using the saline drop test.  The abdomen was then insufflated to 15 mmHg pressure.  An 11 mm trocar was introduced into the abdominal cavity under direct visualization without difficulty.  The patient was placed in reverse Trendelenburg position and an additional 11 mm trocar was placed in the epigastric region and 5 mm trochars were placed the right upper quadrant and right flank regions.  The liver was inspected and noted to be within normal limits.  The gallbladder was retracted in a dynamic fashion in order to provide a critical view of the triangle of Calot.  The cystic duct was first identified.  Its juncture to the infundibulum was fully identified.  Endoclips were placed proximally and distally on the cystic duct, and the cystic duct was divided.  This was likewise done the cystic artery.  The gallbladder was freed away from the gallbladder fossa using Bovie electrocautery.  The gallbladder was delivered through the epigastric trocar site using an Endo  Catch bag.  The gallbladder fossa was inspected and no abnormal bleeding or bile leakage was noted.  Surgicel was placed in the gallbladder fossa.  All fluid and air were then evacuated from the abdominal cavity prior to the removal of the trochars.  All wounds were irrigated with normal saline.  All wounds were injected with Exparel.  The supraumbilical fascia was reapproximated using 0 Vicryl interrupted suture.  All skin incisions were closed using a 4-0 Monocryl subcuticular suture.  Dermabond was applied.  All tape and needle counts were correct at the end of the procedure.  The patient was extubated in the operating room and transferred to PACU in stable condition.  Complications: None  EBL: Minimal  Specimen: Gallbladder

## 2018-08-13 NOTE — Anesthesia Preprocedure Evaluation (Signed)
Anesthesia Evaluation  Patient identified by MRN, date of birth, ID band Patient awake    Reviewed: Allergy & Precautions, NPO status , Patient's Chart, lab work & pertinent test results  Airway Mallampati: II  TM Distance: >3 FB Neck ROM: Full    Dental no notable dental hx. (+) Teeth Intact   Pulmonary sleep apnea and Continuous Positive Airway Pressure Ventilation ,    Pulmonary exam normal breath sounds clear to auscultation       Cardiovascular Exercise Tolerance: Good hypertension, Pt. on medications negative cardio ROS Normal cardiovascular examI Rhythm:Regular Rate:Normal  Limited ET related to MS -pain   Neuro/Psych PSYCHIATRIC DISORDERS Depression negative neurological ROS     GI/Hepatic Neg liver ROS, GERD  Medicated and Controlled,  Endo/Other  negative endocrine ROS  Renal/GU negative Renal ROS  negative genitourinary   Musculoskeletal negative musculoskeletal ROS (+)   Abdominal   Peds negative pediatric ROS (+)  Hematology negative hematology ROS (+)   Anesthesia Other Findings   Reproductive/Obstetrics negative OB ROS                             Anesthesia Physical Anesthesia Plan  ASA: II  Anesthesia Plan: General   Post-op Pain Management:    Induction: Intravenous  PONV Risk Score and Plan: 3 and Dexamethasone, Ondansetron and Treatment may vary due to age or medical condition  Airway Management Planned:   Additional Equipment:   Intra-op Plan:   Post-operative Plan: Extubation in OR  Informed Consent: I have reviewed the patients History and Physical, chart, labs and discussed the procedure including the risks, benefits and alternatives for the proposed anesthesia with the patient or authorized representative who has indicated his/her understanding and acceptance.     Dental advisory given  Plan Discussed with: CRNA  Anesthesia Plan Comments:  (Plan Full PPE use  Plan GETA -WTP with same after Q&A)        Anesthesia Quick Evaluation

## 2018-08-13 NOTE — Anesthesia Procedure Notes (Signed)
Procedure Name: Intubation Date/Time: 08/13/2018 7:22 AM Performed by: Andree Elk, Amy A, CRNA Pre-anesthesia Checklist: Patient identified, Patient being monitored, Timeout performed, Emergency Drugs available and Suction available Patient Re-evaluated:Patient Re-evaluated prior to induction Oxygen Delivery Method: Circle system utilized Preoxygenation: Pre-oxygenation with 100% oxygen Induction Type: IV induction Laryngoscope Size: 3 and Glidescope Grade View: Grade I Tube type: Oral Tube size: 7.0 mm Number of attempts: 1 Airway Equipment and Method: Stylet Placement Confirmation: ETT inserted through vocal cords under direct vision,  positive ETCO2 and breath sounds checked- equal and bilateral Secured at: 21 cm Tube secured with: Tape Dental Injury: Teeth and Oropharynx as per pre-operative assessment

## 2018-08-13 NOTE — Transfer of Care (Signed)
Immediate Anesthesia Transfer of Care Note  Patient: Madison Oliver  Procedure(s) Performed: LAPAROSCOPIC CHOLECYSTECTOMY (N/A )  Patient Location: PACU  Anesthesia Type:General  Level of Consciousness: awake, alert , oriented and patient cooperative  Airway & Oxygen Therapy: Patient Spontanous Breathing and Patient connected to face mask oxygen  Post-op Assessment: Report given to RN and Post -op Vital signs reviewed and stable  Post vital signs: Reviewed and stable  Last Vitals:  Vitals Value Taken Time  BP    Temp    Pulse 79 08/13/18 0809  Resp 13 08/13/18 0809  SpO2 98 % 08/13/18 0809  Vitals shown include unvalidated device data.  Last Pain:  Vitals:   08/13/18 0645  TempSrc: Oral  PainSc: 5       Patients Stated Pain Goal: 8 (63/[id number])  Complications: No apparent anesthesia complications

## 2018-08-13 NOTE — Discharge Instructions (Signed)
Laparoscopic Cholecystectomy, Care After This sheet gives you information about how to care for yourself after your procedure. Your health care provider may also give you more specific instructions. If you have problems or questions, contact your health care provider. What can I expect after the procedure? After the procedure, it is common to have:  Pain at your incision sites. You will be given medicines to control this pain.  Mild nausea or vomiting.  Bloating and possible shoulder pain from the air-like gas that was used during the procedure. Follow these instructions at home: Incision care   Follow instructions from your health care provider about how to take care of your incisions. Make sure you: ? Wash your hands with soap and water before you change your bandage (dressing). If soap and water are not available, use hand sanitizer. ? Change your dressing as told by your health care provider. ? Leave stitches (sutures), skin glue, or adhesive strips in place. These skin closures may need to be in place for 2 weeks or longer. If adhesive strip edges start to loosen and curl up, you may trim the loose edges. Do not remove adhesive strips completely unless your health care provider tells you to do that.  Do not take baths, swim, or use a hot tub until your health care provider approves. Ask your health care provider if you can take showers. You may only be allowed to take sponge baths for bathing.  Check your incision area every day for signs of infection. Check for: ? More redness, swelling, or pain. ? More fluid or blood. ? Warmth. ? Pus or a bad smell. Activity  Do not drive or use heavy machinery while taking prescription pain medicine.  Do not lift anything that is heavier than 10 lb (4.5 kg) until your health care provider approves.  Do not play contact sports until your health care provider approves.  Do not drive for 24 hours if you were given a medicine to help you relax  (sedative).  Rest as needed. Do not return to work or school until your health care provider approves. General instructions  Take over-the-counter and prescription medicines only as told by your health care provider.  To prevent or treat constipation while you are taking prescription pain medicine, your health care provider may recommend that you: ? Drink enough fluid to keep your urine clear or pale yellow. ? Take over-the-counter or prescription medicines. ? Eat foods that are high in fiber, such as fresh fruits and vegetables, whole grains, and beans. ? Limit foods that are high in fat and processed sugars, such as fried and sweet foods. Contact a health care provider if:  You develop a rash.  You have more redness, swelling, or pain around your incisions.  You have more fluid or blood coming from your incisions.  Your incisions feel warm to the touch.  You have pus or a bad smell coming from your incisions.  You have a fever.  One or more of your incisions breaks open. Get help right away if:  You have trouble breathing.  You have chest pain.  You have increasing pain in your shoulders.  You faint or feel dizzy when you stand.  You have severe pain in your abdomen.  You have nausea or vomiting that lasts for more than one day.  You have leg pain. This information is not intended to replace advice given to you by your health care provider. Make sure you discuss any questions you have  with your health care provider. °Document Released: 01/22/2005 Document Revised: 01/04/2017 Document Reviewed: 07/11/2015 °Elsevier Patient Education © 2020 Elsevier Inc[employer] ° ° ° ° °General Anesthesia, Adult, Care After °This sheet gives you information about how to care for yourself after your procedure. Your health care provider may also give you more specific instructions. If you have problems or questions, contact your health care provider. °What can I expect after the procedure? °After the  procedure, the following side effects are common: °· Pain or discomfort at the IV site. °· Nausea. °· Vomiting. °· Sore throat. °· Trouble concentrating. °· Feeling cold or chills. °· Weak or tired. °· Sleepiness and fatigue. °· Soreness and body aches. These side effects can affect parts of the body that were not involved in surgery. °Follow these instructions at home: ° °For at least 24 hours after the procedure: °· Have a responsible adult stay with you. It is important to have someone help care for you until you are awake and alert. °· Rest as needed. °· Do not: °? Participate in activities in which you could fall or become injured. °? Drive. °? Use heavy machinery. °? Drink alcohol. °? Take sleeping pills or medicines that cause drowsiness. °? Make important decisions or sign legal documents. °? Take care of children on your own. °Eating and drinking °· Follow any instructions from your health care provider about eating or drinking restrictions. °· When you feel hungry, start by eating small amounts of foods that are soft and easy to digest (bland), such as toast. Gradually return to your regular diet. °· Drink enough fluid to keep your urine pale yellow. °· If you vomit, rehydrate by drinking water, juice, or clear broth. °General instructions °· If you have sleep apnea, surgery and certain medicines can increase your risk for breathing problems. Follow instructions from your health care provider about wearing your sleep device: °? Anytime you are sleeping, including during daytime naps. °? While taking prescription pain medicines, sleeping medicines, or medicines that make you drowsy. °· Return to your normal activities as told by your health care provider. Ask your health care provider what activities are safe for you. °· Take over-the-counter and prescription medicines only as told by your health care provider. °· If you smoke, do not smoke without supervision. °· Keep all follow-up visits as told by your  health care provider. This is important. °Contact a health care provider if: °· You have nausea or vomiting that does not get better with medicine. °· You cannot eat or drink without vomiting. °· You have pain that does not get better with medicine. °· You are unable to pass urine. °· You develop a skin rash. °· You have a fever. °· You have redness around your IV site that gets worse. °Get help right away if: °· You have difficulty breathing. °· You have chest pain. °· You have blood in your urine or stool, or you vomit blood. °Summary °· After the procedure, it is common to have a sore throat or nausea. It is also common to feel tired. °· Have a responsible adult stay with you for the first 24 hours after general anesthesia. It is important to have someone help care for you until you are awake and alert. °· When you feel hungry, start by eating small amounts of foods that are soft and easy to digest (bland), such as toast. Gradually return to your regular diet. °· Drink enough fluid to keep your urine pale yellow. °· Return   to your normal activities as told by your health care provider. Ask your health care provider what activities are safe for you. °This information is not intended to replace advice given to you by your health care provider. Make sure you discuss any questions you have with your health care provider. °Document Released: 04/30/2000 Document Revised: 01/25/2017 Document Reviewed: 09/07/2016 °Elsevier Patient Education © 2020 Elsevier Inc. ° °

## 2018-08-13 NOTE — Anesthesia Postprocedure Evaluation (Signed)
Anesthesia Post Note  Patient: Madison Oliver  Procedure(s) Performed: LAPAROSCOPIC CHOLECYSTECTOMY (N/A )  Patient location during evaluation: PACU Anesthesia Type: General Level of consciousness: awake and alert and oriented Pain management: pain level controlled Vital Signs Assessment: post-procedure vital signs reviewed and stable Respiratory status: spontaneous breathing and respiratory function stable Cardiovascular status: stable Postop Assessment: no apparent nausea or vomiting Anesthetic complications: no     Last Vitals:  Vitals:   08/13/18 0700 08/13/18 0809  BP: 110/73   Resp:    Temp:  36.6 C  SpO2:      Last Pain:  Vitals:   08/13/18 0809  TempSrc:   PainSc: 0-No pain                 [name]

## 2018-08-13 NOTE — Interval H&P Note (Signed)
History and Physical Interval Note:  08/13/2018 7:09 AM  Madison Oliver  has presented today for surgery, with the diagnosis of chronic cholecystitis.  The various methods of treatment have been discussed with the patient and family. After consideration of risks, benefits and other options for treatment, the patient has consented to  Procedure(s): LAPAROSCOPIC CHOLECYSTECTOMY (N/A) as a surgical intervention.  The patient's history has been reviewed, patient examined, no change in status, stable for surgery.  I have reviewed the patient's chart and labs.  Questions were answered to the patient's satisfaction.     Aviva Signs

## 2018-08-14 ENCOUNTER — Encounter (HOSPITAL_COMMUNITY): Payer: Self-pay | Admitting: General Surgery

## 2018-08-14 ENCOUNTER — Encounter: Payer: Self-pay | Admitting: General Surgery

## 2018-08-19 ENCOUNTER — Telehealth: Payer: Self-pay

## 2018-08-19 NOTE — Telephone Encounter (Signed)
I am working on PA for Amitiza 8 mcg. LMOM for a return call to discuss previous OTC meds.

## 2018-08-20 NOTE — Telephone Encounter (Signed)
PT started for the Amtiza 8 mcg.

## 2018-08-21 ENCOUNTER — Telehealth (INDEPENDENT_AMBULATORY_CARE_PROVIDER_SITE_OTHER): Payer: Self-pay | Admitting: General Surgery

## 2018-08-21 ENCOUNTER — Other Ambulatory Visit: Payer: Self-pay

## 2018-08-21 DIAGNOSIS — Z09 Encounter for follow-up examination after completed treatment for conditions other than malignant neoplasm: Secondary | ICD-10-CM

## 2018-08-21 NOTE — Telephone Encounter (Signed)
Called patient on her mobile phone.  This was a virtual postoperative visit.  A message was left to call my office if there are any issues.

## 2018-08-28 NOTE — Telephone Encounter (Signed)
Elene from [employer] called to ask if the pt has had an intolerance to [employer]. I didn't see that the pt has tried Motegrity and let Elene know that. If any changes need to be made in reference to [facility], please let me know.

## 2018-08-29 DIAGNOSIS — G35 Multiple sclerosis: Secondary | ICD-10-CM | POA: Diagnosis not present

## 2018-08-29 DIAGNOSIS — G4700 Insomnia, unspecified: Secondary | ICD-10-CM | POA: Diagnosis not present

## 2018-08-29 DIAGNOSIS — F3341 Major depressive disorder, recurrent, in partial remission: Secondary | ICD-10-CM | POA: Diagnosis not present

## 2018-09-01 NOTE — Telephone Encounter (Signed)
LMOM for a return call from pt.  

## 2018-09-02 NOTE — Telephone Encounter (Signed)
Pt said she has never tried [employer].

## 2018-09-03 IMAGING — US US ABDOMEN COMPLETE
1 series · 14 of 25 positions shown · non-contrast
Comparison: None.

CLINICAL DATA: Bloating and epigastric pain extending to the back

EXAM:
ABDOMEN ULTRASOUND COMPLETE

[Series 1: us abdomen complete · 0.19mm/px · 14 of 96 slices shown]
[im 1/96]
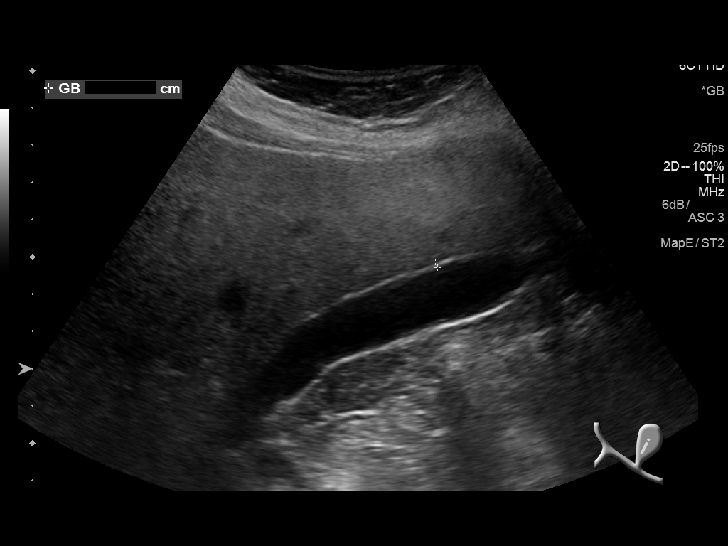
[im 8/96]
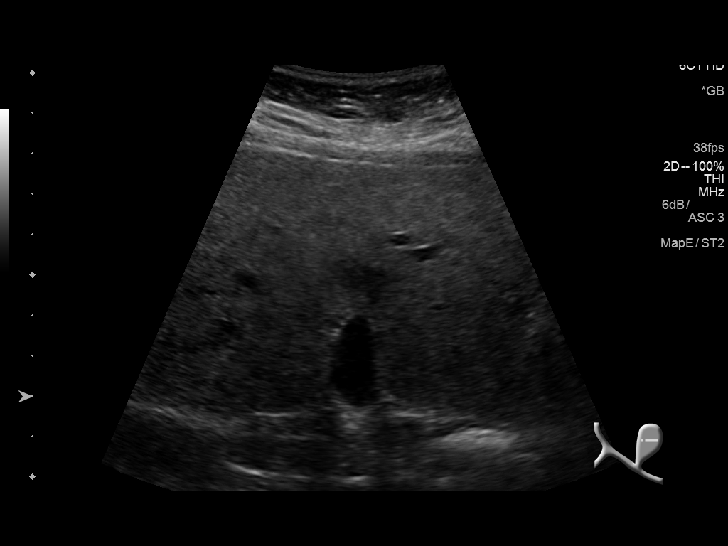
[im 16/96]
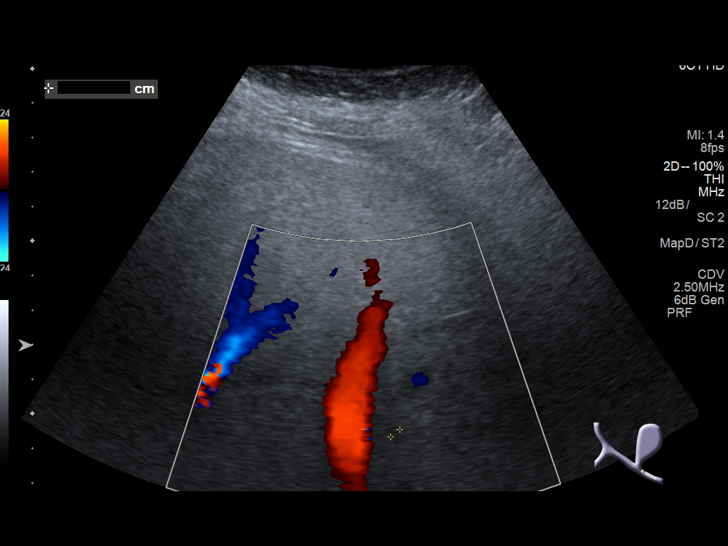
[im 24/96]
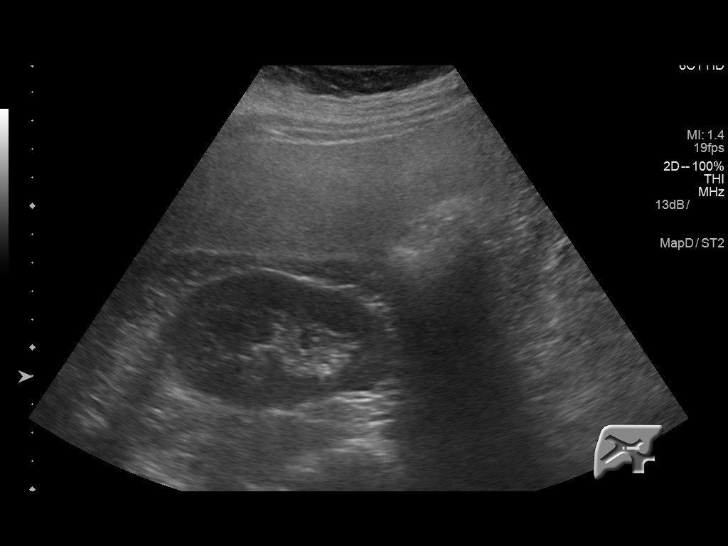
[im 32/96]
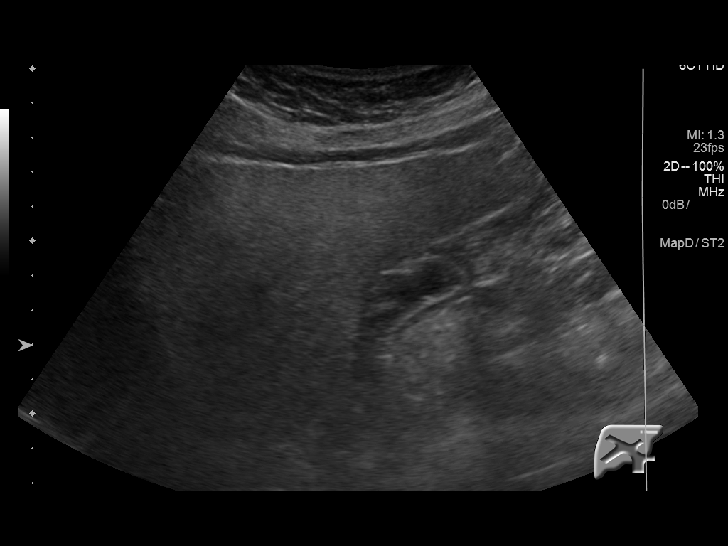
[im 36/96]
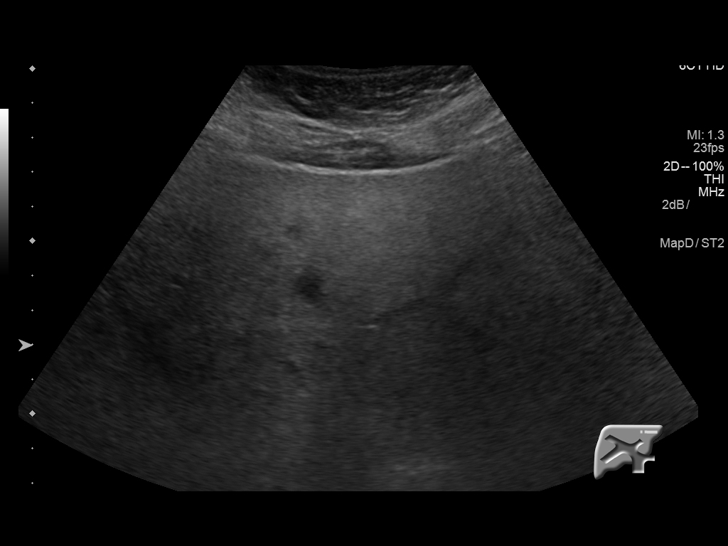
[im 44/96]
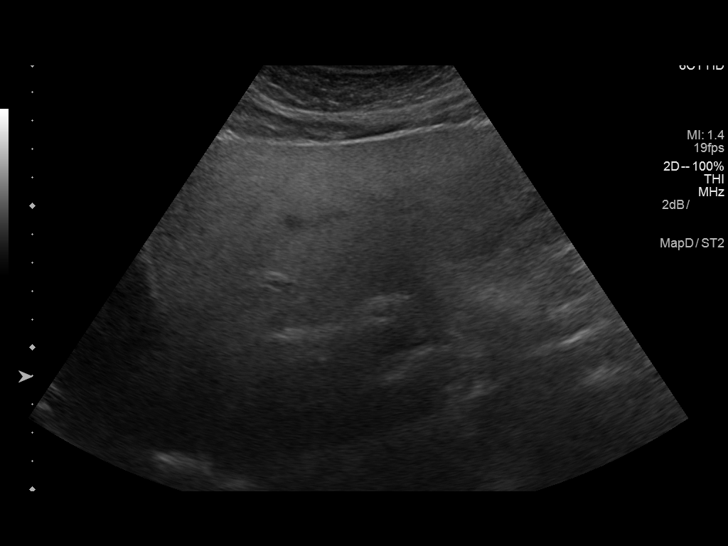
[im 52/96]
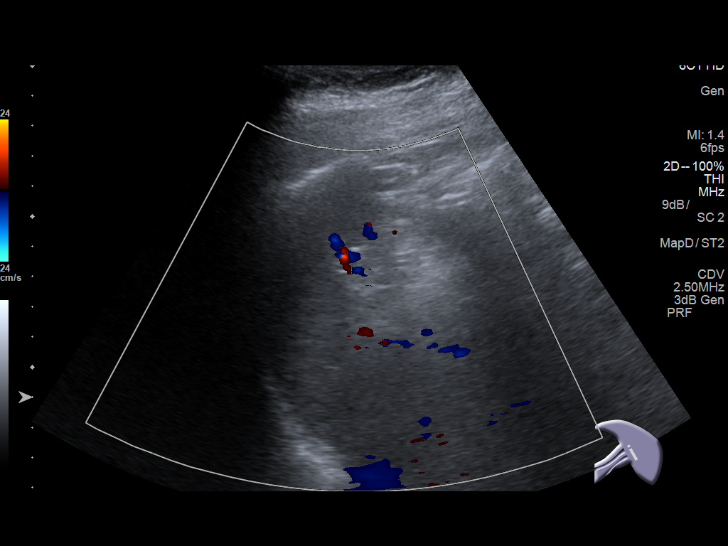
[im 60/96]
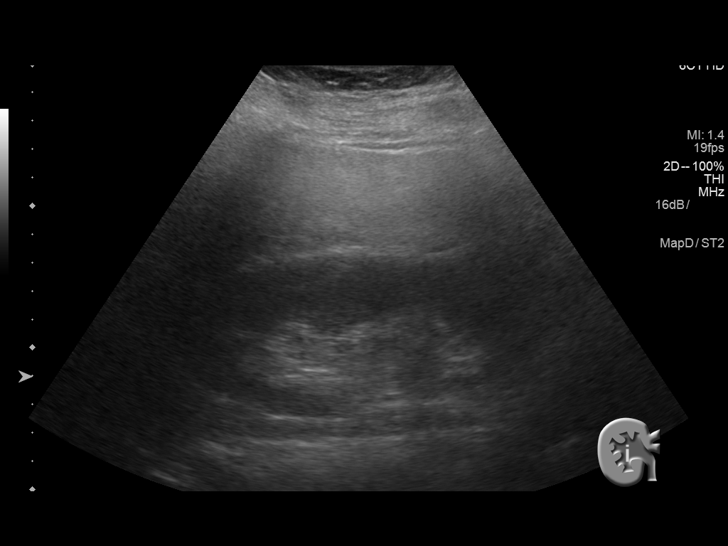
[im 64/96]
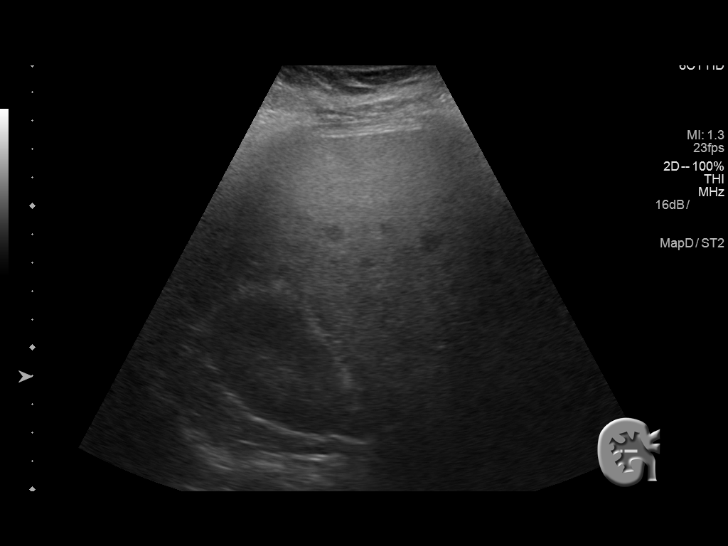
[im 72/96]
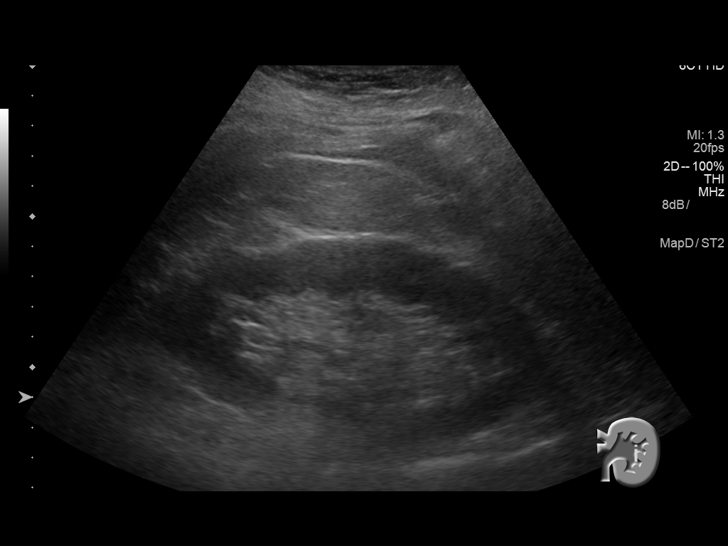
[im 80/96]
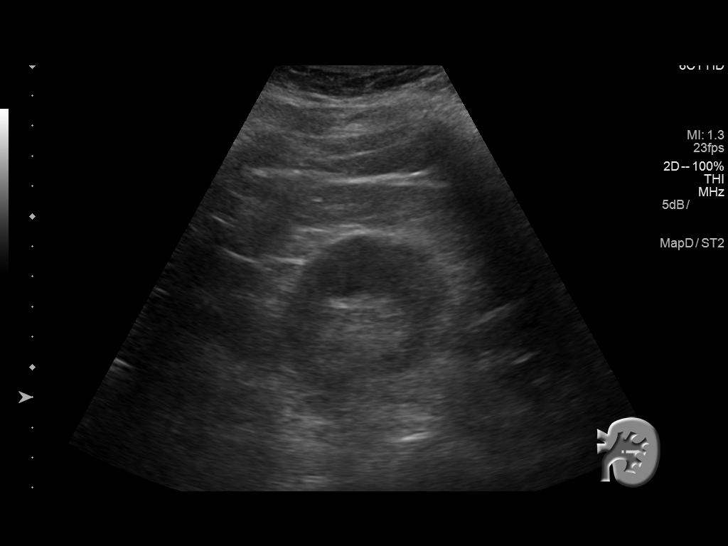
[im 88/96]
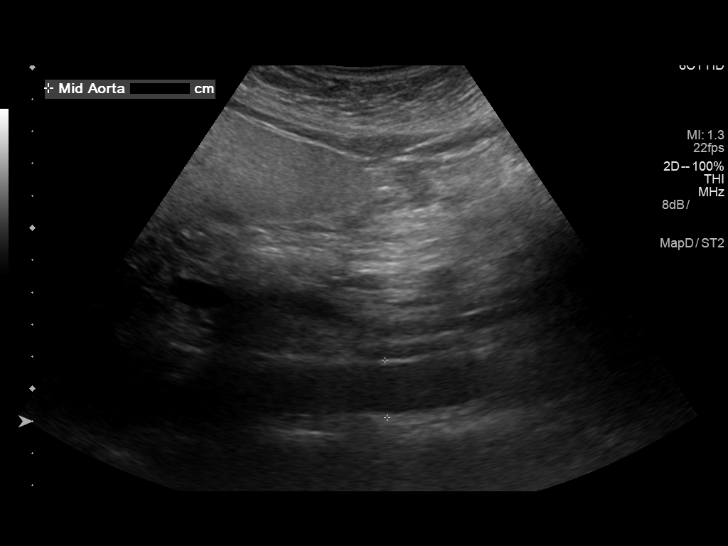
[im 96/96]
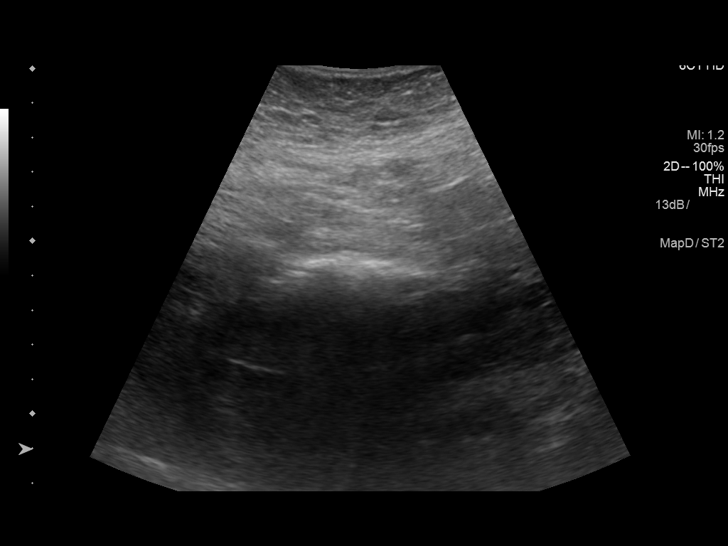

[14 of 25 positions shown; findings below may reference images not displayed]

FINDINGS: Gallbladder: No gallstones or wall thickening visualized. No
sonographic Murphy sign noted by sonographer.

Common bile duct: Diameter: 3 mm

Liver: Echogenic liver with pericholecystic sparing and diminished
acoustic penetration portal vein is patent on color Doppler imaging
with normal direction of blood flow towards the liver.

IVC: No abnormality visualized.

Pancreas: Visualized portion unremarkable.

Spleen: Size and appearance within normal limits.

Right Kidney: Length: 13 cm. Echogenicity within normal limits. No
mass or hydronephrosis visualized.

Left Kidney: Length: Is 13 cm. Echogenicity within normal limits. No
mass or hydronephrosis visualized.

Abdominal aorta: No aneurysm visualized.

Other findings: None.
IMPRESSION: Hepatic steatosis.

## 2018-09-08 MED ORDER — MOTEGRITY 2 MG PO TABS: 1.0000 | ORAL_TABLET | Freq: Every day | ORAL | 3 refills | Status: DC

## 2018-09-08 NOTE — Telephone Encounter (Signed)
Noted. I have sent in [place] to local pharmacy. Let us know how this works for her. I only did a 30 day supply.

## 2018-09-08 NOTE — Telephone Encounter (Signed)
Madison Oliver, can you send in [place] for pt to try?

## 2018-09-08 NOTE — Addendum Note (Signed)
Addended by: Annitta Needs on: 09/08/2018 02:25 PM   Modules accepted: Orders

## 2018-09-08 NOTE — Telephone Encounter (Signed)
Pt is aware and will let us know in a couple of weeks how it is doing.

## 2018-09-08 NOTE — Telephone Encounter (Signed)
Motegrity can be called in; however, the patient needs to know the following from the drug company:  "In clinical trials, suicides, suicide attempts and suicidal ideation have been reported. A causal association between treatment with Motegrity and an increased risk of suicidal ideation and behavior has not been established. Monitor patients for persistent worsening of depression and emergence of suicidal thoughts and behavior. Instruct patients to discontinue Motegrity immediately and contact their healthcare provider if their depression is persistently worse, or they experience emerging suicidal thoughts or behaviors." (taken from the website).   No cause and effect. I believe these incidences were after the drug had been stopped. This is just something to be aware of. If she is not comfortable with this, we can write this on the PA and pursue Amitiza.

## 2018-09-08 NOTE — Telephone Encounter (Signed)
Pt is aware of this info. Said she is willing to try Motegrity and she will call immediately if she has any suicidal thoughts, etc.  Michela Pitcher she already suffers from depression and is on Celexa.  Vicente Males, please advise!

## 2018-09-22 DIAGNOSIS — F3341 Major depressive disorder, recurrent, in partial remission: Secondary | ICD-10-CM | POA: Diagnosis not present

## 2018-09-22 DIAGNOSIS — G4700 Insomnia, unspecified: Secondary | ICD-10-CM | POA: Diagnosis not present

## 2018-11-10 DIAGNOSIS — E559 Vitamin D deficiency, unspecified: Secondary | ICD-10-CM | POA: Diagnosis not present

## 2018-11-10 DIAGNOSIS — E049 Nontoxic goiter, unspecified: Secondary | ICD-10-CM | POA: Diagnosis not present

## 2018-11-10 DIAGNOSIS — Z23 Encounter for immunization: Secondary | ICD-10-CM | POA: Diagnosis not present

## 2018-11-10 DIAGNOSIS — Z79899 Other long term (current) drug therapy: Secondary | ICD-10-CM | POA: Diagnosis not present

## 2018-11-10 DIAGNOSIS — E782 Mixed hyperlipidemia: Secondary | ICD-10-CM | POA: Diagnosis not present

## 2018-11-14 DIAGNOSIS — E782 Mixed hyperlipidemia: Secondary | ICD-10-CM | POA: Diagnosis not present

## 2018-11-14 DIAGNOSIS — I10 Essential (primary) hypertension: Secondary | ICD-10-CM | POA: Diagnosis not present

## 2018-11-14 DIAGNOSIS — Z713 Dietary counseling and surveillance: Secondary | ICD-10-CM | POA: Diagnosis not present

## 2018-11-14 DIAGNOSIS — R739 Hyperglycemia, unspecified: Secondary | ICD-10-CM | POA: Diagnosis not present

## 2018-11-25 DIAGNOSIS — R5383 Other fatigue: Secondary | ICD-10-CM | POA: Diagnosis not present

## 2018-11-25 DIAGNOSIS — G35 Multiple sclerosis: Secondary | ICD-10-CM | POA: Diagnosis not present

## 2018-12-15 DIAGNOSIS — Z20828 Contact with and (suspected) exposure to other viral communicable diseases: Secondary | ICD-10-CM | POA: Diagnosis not present

## 2018-12-31 DIAGNOSIS — M5432 Sciatica, left side: Secondary | ICD-10-CM | POA: Diagnosis not present

## 2019-01-06 NOTE — Progress Notes (Deleted)
Referring Provider: Erasmo Downer, NP Primary Care Physician:  Erasmo Downer, NP Primary GI Physician: Dr. Darrick Penna  No chief complaint on file.   HPI:   Madison Oliver is a 48 y.o. female with GERD, gastritis, constipation, dysphagia, and self reported Barrett's esophagus, but EGD in 2016 at outside facility without evidence for this (path without esophageal biopsies at that time) and most recent EGD in 2018 with benign-appearing esophageal stricture due to GERD s/p dilation, small hiatal hernia, mild gastritis/duodenitis due to naproxen.  No obvious evidence of Barrett's on exam.  Colonoscopy up-to-date in 2018 with redundant left colon, external and internal hemorrhoids, otherwise normal exam.  Next colonoscopy in 2028.  Patient was last seen via telemedicine visit for constipation, abdominal pain, and dysphagia.  Patient requested to try Amitiza 8 mcg twice daily as low-dose Linzess and Amitiza 24 mcg once daily caused diarrhea.  Dysphagia symptoms are mild with patient feeling food going down at times.  Last EGD/ED in 2018, BPE with normal motility in 2019, ultrasound thyroid with normal thyroid and small nodule without need for biopsy in 2019.  Patient has been advised to follow-up with ENT.  Regarding GERD, Prilosec was switched to Protonix as patient was on Celexa. Regarding abdominal pain, patient reported feeling bloated with eating.  Prior work-up had included EGD, colonoscopy, and addressing constipation but symptoms persisted.  In 2019, ultrasound with fatty liver and no stones.  HIDA was normal but patient reported reproducible symptoms.  She was referred to Dr. Lovell Sheehan for possible cholecystectomy.  Cholecystectomy in July 2020.  The PA was started for Amitiza.  [facility] requested patient to try Motegrity.  30-day supply of Motegrity was sent to pharmacy on 09/08/2018.  Patient was to let us know how this works for her.  Today she states   Past Medical History:   Diagnosis Date  . Barrett esophagus   . Chronic pain   . Depression   . GERD (gastroesophageal reflux disease)   . HTN (hypertension)   . Hypercholesterolemia   . MS (multiple sclerosis) (HCC)   . RLS (restless legs syndrome)   . Sleep apnea    not using CPAP; cannot tolerate, PCP not aware.    Past Surgical History:  Procedure Laterality Date  . ABDOMINAL HYSTERECTOMY     still has ovaries  . BIOPSY  01/08/2017   Procedure: BIOPSY;  Surgeon: West Bali, MD;  Location: [facility];  Service: Endoscopy;;  gastric  . CHOLECYSTECTOMY N/A 08/13/2018   Procedure: LAPAROSCOPIC CHOLECYSTECTOMY;  Surgeon: Franky Macho, MD;  Location: [facility] OR[facility];  Service: General;  Laterality: N/A;  . COLONOSCOPY WITH PROPOFOL N/A 01/08/2017   Redundant left colon, otherwise normal, external/internal hemorrhoids  . ESOPHAGOGASTRODUODENOSCOPY  2016   outside facility:  hiatal hernia, gastirtis, esophageal stricture. path with benign fragments of duodenum without pathological change.   . ESOPHAGOGASTRODUODENOSCOPY (EGD) WITH PROPOFOL N/A 01/08/2017   Benign-appearing esophageal stricture due to GERD s/p dilation, small hiatal hernia, mild gastritis/duodenitis due to Naproxen  . SAVORY DILATION N/A 01/08/2017   Procedure: SAVORY DILATION;  Surgeon: West Bali, MD;  Location: [facility];  Service: Endoscopy;  Laterality: N/A;  . TONSILLECTOMY    . TUBAL LIGATION      Current Outpatient Medications  Medication Sig Dispense Refill  . amantadine (SYMMETREL) 100 MG capsule Take 100 mg by mouth 2 (two) times daily.    . baclofen (LIORESAL) 10 MG tablet Take 20 mg by mouth 3 (  three) times daily.    . carvedilol (COREG) 6.25 MG tablet Take 6.25 mg by mouth 2 (two) times daily with a meal.    . citalopram (CELEXA) 20 MG tablet Take 20 mg by mouth at bedtime.    . Dimethyl Fumarate (TECFIDERA) 240 MG CPDR Take 240 mg by mouth 2 (two) times daily.     . diphenhydrAMINE (BENADRYL) 25 MG tablet Take 25 mg by  mouth every 6 (six) hours as needed for allergies.    . hydrochlorothiazide (HYDRODIURIL) 25 MG tablet Take 25 mg by mouth daily.    Marland Kitchen HYDROcodone-acetaminophen (NORCO) 5-325 MG tablet Take 1 tablet by mouth every 4 (four) hours as needed for moderate pain. 30 tablet 0  . lidocaine (LIDODERM) 5 % Place 1 patch onto the skin daily as needed (for back pain.). Remove & Discard patch within 12 hours or as directed by MD    . losartan (COZAAR) 100 MG tablet Take 100 mg by mouth daily.    Marland Kitchen lubiprostone (AMITIZA) 8 MCG capsule Take 1 capsule (8 mcg total) by mouth 2 (two) times daily with a meal. (Patient taking differently: Take 8 mcg by mouth daily. ) 90 capsule 3  . mirabegron ER (MYRBETRIQ) 25 MG TB24 tablet Take 25 mg by mouth daily.    . naproxen (NAPROSYN) 500 MG tablet Take 500 mg by mouth 2 (two) times daily as needed for moderate pain.     . pantoprazole (PROTONIX) 40 MG tablet Take 1 tablet (40 mg total) by mouth daily. 30 minutes before breakfast 90 tablet 3  . Polyethyl Glycol-Propyl Glycol (LUBRICANT EYE DROPS) 0.4-0.3 % SOLN Place 1-2 drops into both eyes 3 (three) times daily as needed (for dry eyes.).    Marland Kitchen Pregabalin ER (LYRICA CR) 165 MG TB24 Take 165 mg by mouth 3 (three) times daily.    . Prucalopride Succinate (MOTEGRITY) 2 MG TABS Take 1 tablet by mouth daily. 30 tablet 3  . simvastatin (ZOCOR) 40 MG tablet Take 40 mg by mouth daily.    . traZODone (DESYREL) 100 MG tablet Take 50 mg by mouth at bedtime.    Marland Kitchen VICTOZA 18 MG/3ML SOPN Inject 1.8 mg into the skin daily.    . Vitamin D, Ergocalciferol, (DRISDOL) 1.25 MG (50000 UT) CAPS capsule Take 50,000 Units by mouth every Tuesday.    . zolpidem (AMBIEN) 10 MG tablet Take 5 mg by mouth at bedtime.      No current facility-administered medications for this visit.     Allergies as of 01/07/2019 - Review Complete 08/13/2018  Allergen Reaction Noted  . Prednisone Shortness Of Breath 03/03/2018  . Penicillins Other (See Comments)  05/28/2012  . Tizanidine Swelling 12/25/2016  . Methylprednisolone Itching 04/19/2017    Family History  Problem Relation Age of Onset  . Colon polyps Mother 31  . Colon cancer Neg Hx     Social History   Socioeconomic History  . Marital status: Married    Spouse name: Not on file  . Number of children: Not on file  . Years of education: Not on file  . Highest education level: Not on file  Occupational History  . Occupation: disability  Social Needs  . Financial resource strain: Not on file  . Food insecurity    Worry: Not on file    Inability: Not on file  . Transportation needs    Medical: Not on file    Non-medical: Not on file  Tobacco Use  . Smoking status:  Never Smoker  . Smokeless tobacco: Never Used  Substance and Sexual Activity  . Alcohol use: No  . Drug use: No  . Sexual activity: Yes    Birth control/protection: Surgical  Lifestyle  . Physical activity    Days per week: Not on file    Minutes per session: Not on file  . Stress: Not on file  Relationships  . Social Musician on phone: Not on file    Gets together: Not on file    Attends religious service: Not on file    Active member of club or organization: Not on file    Attends meetings of clubs or organizations: Not on file    Relationship status: Not on file  Other Topics Concern  . Not on file  Social History Narrative  . Not on file    Review of Systems: Gen: Denies fever, chills, anorexia. Denies fatigue, weakness, weight loss.  CV: Denies chest pain, palpitations, syncope, peripheral edema, and claudication. Resp: Denies dyspnea at rest, cough, wheezing, coughing up blood, and pleurisy. GI: Denies vomiting blood, jaundice, and fecal incontinence.   Denies dysphagia or odynophagia. Derm: Denies rash, itching, dry skin Psych: Denies depression, anxiety, memory loss, confusion. No homicidal or suicidal ideation.  Heme: Denies bruising, bleeding, and enlarged lymph nodes.   Physical Exam: There were no vitals taken for this visit. General:   Alert and oriented. No distress noted. Pleasant and cooperative.  Head:  Normocephalic and atraumatic. Eyes:  Conjuctiva clear without scleral icterus. Mouth:  Oral mucosa pink and moist. Good dentition. No lesions. Heart:  S1, S2 present without murmurs appreciated. Lungs:  Clear to auscultation bilaterally. No wheezes, rales, or rhonchi. No distress.  Abdomen:  +BS, soft, non-tender and non-distended. No rebound or guarding. No HSM or masses noted. Msk:  Symmetrical without gross deformities. Normal posture. Extremities:  Without edema. Neurologic:  Alert and  oriented x4 Psych:  Alert and cooperative. Normal mood and affect.

## 2019-01-07 ENCOUNTER — Encounter: Payer: Self-pay | Admitting: Gastroenterology

## 2019-01-07 ENCOUNTER — Telehealth: Payer: Self-pay | Admitting: Gastroenterology

## 2019-01-07 ENCOUNTER — Ambulatory Visit: Payer: BC Managed Care – PPO | Admitting: Gastroenterology

## 2019-01-07 NOTE — Telephone Encounter (Signed)
Noted  

## 2019-01-07 NOTE — Telephone Encounter (Signed)
Patient was a no show and letter sent  °

## 2019-01-13 DIAGNOSIS — J209 Acute bronchitis, unspecified: Secondary | ICD-10-CM | POA: Diagnosis not present

## 2019-01-16 DIAGNOSIS — F3341 Major depressive disorder, recurrent, in partial remission: Secondary | ICD-10-CM | POA: Diagnosis not present

## 2019-01-16 DIAGNOSIS — G4700 Insomnia, unspecified: Secondary | ICD-10-CM | POA: Diagnosis not present

## 2019-01-25 NOTE — Progress Notes (Signed)
Referring Provider: Erasmo Downer, NP Primary Care Physician:  Erasmo Downer, NP Primary GI Physician: Dr. Darrick Penna  Chief Complaint  Patient presents with  . change in bowel    accidents about 4-5 times per week    HPI:   Madison Oliver is a 48 y.o. female with history of GERD, gastritis, dysphagia s/p dilation in 2018 and BPE in 2019 unrevealing, self reported Barrett's esophagus but EGD in 2016 at outside facility without evidence for this and most recent EGD in 2018 without obvious evidence of Barrett's on exam, and constipation.  Colonoscopy up-to-date in 2018 with redundant left colon, external and internal hemorrhoids.  Due for repeat in 2028.  She presents today for follow-up.   Last seen in our office on 07/01/2018.  Reported abdominal discomfort/feeling bloated with eating.  HIDA scan with reproducible symptoms.  For GERD, she was taking Prilosec but had run out.  Dexilant was too expensive.  She reported being able to feel food going down at times.  For constipation, she desired to try Amitiza 8 mcg twice daily again.  Diarrhea on and off with certain foods including current grains, corn, ground beef, and roast beef causes cramping.  Linzess at low doses caused diarrhea.  Amitiza 24 mcg once daily caused diarrhea.  Plans at that time included decreasing Amitiza to 8 mcg twice daily, stopping Prilosec due to Celexa and starting Protonix daily, and consultation with surgery for possible cholecystectomy due to postprandial bloating and symptoms during HIDA.   She underwent laparoscopic cholecystectomy on 08/13/2018.  Surgical pathology with chronic cholecystitis.  Telephone call on 08/19/2018 with [facility] requesting patient try Motegrity.  30-day supply of Motegrity was sent to patient's pharmacy.  Patient was to call in a couple weeks with a progress report.   Today:  GERD:  GERD symptoms well controlled. Rare breakthrough symptoms if she drinks orange juice.  Taking  Protonix 40 mg daily.  No nausea or vomiting. No abdominal pain.  No dysphgia. Naproxen maybe once a week or less.  Bloating: Much improved/resolved since cholecystectomy.     Constipation: Taking Motegrity daily. If she coughs or sneezes, she feels like she has a small amount of leakage. Not in her underwear, just when she wipes. About 3 times a week. Started about 1 month ago. Started Motegrity in July 2020. This is working well. Having at least 1 BM a day. Sometimes 2. Stools are typically loose. Sometimes watery. Not as bad as with Linzess. Sometimes, she does have urgency, usually after eating a fatty meal. Notice seepage more when she has eaten a fatty meal and has diarrhea with urgency. Feels BMs are complete. She is happy with how Motegrity is working. No blood in the stool. No black stools.    Past Medical History:  Diagnosis Date  . Barrett esophagus   . Chronic pain   . Depression   . GERD (gastroesophageal reflux disease)   . HTN (hypertension)   . Hypercholesterolemia   . MS (multiple sclerosis) (HCC)   . RLS (restless legs syndrome)   . Sleep apnea    not using CPAP; cannot tolerate, PCP not aware.    Past Surgical History:  Procedure Laterality Date  . ABDOMINAL HYSTERECTOMY     still has ovaries  . BIOPSY  01/08/2017   Procedure: BIOPSY;  Surgeon: West Bali, MD;  Location: [facility];  Service: Endoscopy;;  gastric  . CHOLECYSTECTOMY N/A 08/13/2018   Procedure: LAPAROSCOPIC CHOLECYSTECTOMY;  Surgeon: Franky Macho, MD;  Location: [facility];  Service: General;  Laterality: N/A;  . COLONOSCOPY WITH PROPOFOL N/A 01/08/2017   Redundant left colon, otherwise normal, external/internal hemorrhoids  . ESOPHAGOGASTRODUODENOSCOPY  2016   outside facility:  hiatal hernia, gastirtis, esophageal stricture. path with benign fragments of duodenum without pathological change.   . ESOPHAGOGASTRODUODENOSCOPY (EGD) WITH PROPOFOL N/A 01/08/2017   Benign-appearing esophageal stricture  due to GERD s/p dilation, small hiatal hernia, mild gastritis/duodenitis due to Naproxen  . SAVORY DILATION N/A 01/08/2017   Procedure: SAVORY DILATION;  Surgeon: West Bali, MD;  Location: [facility];  Service: Endoscopy;  Laterality: N/A;  . TONSILLECTOMY    . TUBAL LIGATION      Current Outpatient Medications  Medication Sig Dispense Refill  . amantadine (SYMMETREL) 100 MG capsule Take 100 mg by mouth 2 (two) times daily.    . ARIPiprazole (ABILIFY) 5 MG tablet Take 5 mg by mouth daily.    . baclofen (LIORESAL) 10 MG tablet Take 20 mg by mouth 3 (three) times daily.    . carvedilol (COREG) 6.25 MG tablet Take 6.25 mg by mouth 2 (two) times daily with a meal.    . citalopram (CELEXA) 20 MG tablet Take 20 mg by mouth at bedtime.    . Dimethyl Fumarate (TECFIDERA) 240 MG CPDR Take 240 mg by mouth 2 (two) times daily.     . diphenhydrAMINE (BENADRYL) 25 MG tablet Take 25 mg by mouth every 6 (six) hours as needed for allergies.    . hydrochlorothiazide (HYDRODIURIL) 25 MG tablet Take 25 mg by mouth daily.    Marland Kitchen HYDROcodone-acetaminophen (NORCO) 5-325 MG tablet Take 1 tablet by mouth every 4 (four) hours as needed for moderate pain. 30 tablet 0  . losartan (COZAAR) 100 MG tablet Take 100 mg by mouth daily.    . mirabegron ER (MYRBETRIQ) 25 MG TB24 tablet Take 25 mg by mouth daily.    . naproxen (NAPROSYN) 500 MG tablet Take 500 mg by mouth 2 (two) times daily as needed for moderate pain.     . pantoprazole (PROTONIX) 40 MG tablet Take 1 tablet (40 mg total) by mouth daily. 30 minutes before breakfast 90 tablet 3  . Polyethyl Glycol-Propyl Glycol (LUBRICANT EYE DROPS) 0.4-0.3 % SOLN Place 1-2 drops into both eyes 3 (three) times daily as needed (for dry eyes.).    Marland Kitchen Pregabalin ER (LYRICA CR) 165 MG TB24 Take 165 mg by mouth 3 (three) times daily.    . Prucalopride Succinate (MOTEGRITY) 2 MG TABS Take 1 tablet by mouth daily. 30 tablet 3  . simvastatin (ZOCOR) 40 MG tablet Take 40 mg by  mouth daily.    . traZODone (DESYREL) 100 MG tablet Take 50 mg by mouth at bedtime.    Marland Kitchen VICTOZA 18 MG/3ML SOPN Inject 1.8 mg into the skin daily.    . Vitamin D, Ergocalciferol, (DRISDOL) 1.25 MG (50000 UT) CAPS capsule Take 50,000 Units by mouth every Tuesday.     No current facility-administered medications for this visit.    Allergies as of 01/26/2019 - Review Complete 01/26/2019  Allergen Reaction Noted  . Prednisone Shortness Of Breath 03/03/2018  . Penicillins Other (See Comments) 05/28/2012  . Tizanidine Swelling 12/25/2016  . Methylprednisolone Itching 04/19/2017    Family History  Problem Relation Age of Onset  . Colon polyps Mother 83  . Colon cancer Neg Hx     Social History   Socioeconomic History  . Marital status: Married  Spouse name: Not on file  . Number of children: Not on file  . Years of education: Not on file  . Highest education level: Not on file  Occupational History  . Occupation: disability  Tobacco Use  . Smoking status: Never Smoker  . Smokeless tobacco: Never Used  Substance and Sexual Activity  . Alcohol use: No  . Drug use: No  . Sexual activity: Yes    Birth control/protection: Surgical  Other Topics Concern  . Not on file  Social History Narrative  . Not on file   Social Determinants of Health   Financial Resource Strain:   . Difficulty of Paying Living Expenses: Not on file  Food Insecurity:   . Worried About Charity fundraiser in the Last Year: Not on file  . Ran Out of Food in the Last Year: Not on file  Transportation Needs:   . Lack of Transportation (Medical): Not on file  . Lack of Transportation (Non-Medical): Not on file  Physical Activity:   . Days of Exercise per Week: Not on file  . Minutes of Exercise per Session: Not on file  Stress:   . Feeling of Stress : Not on file  Social Connections:   . Frequency of Communication with Friends and Family: Not on file  . Frequency of Social Gatherings with Friends  and Family: Not on file  . Attends Religious Services: Not on file  . Active Member of Clubs or Organizations: Not on file  . Attends Archivist Meetings: Not on file  . Marital Status: Not on file    Review of Systems: Gen: Denies fever, chills, lightheadedness, dizziness, presyncope, or syncope. CV: Denies chest pain. Admits to occasional palpitations.  Follows with cardiology.  Resp: Denies dyspnea or cough. GI: See HPI Derm: Denies rash Psych: Admits to depression and anxiety. Well controlled with medications.  Heme: Denies bruising, bleeding  Physical Exam: BP (!) 145/90   Pulse 72   Temp 97.6 F (36.4 C) (Oral)   Ht 5\' 4"  (1.626 m)   Wt 201 lb 9.6 oz (91.4 kg)   BMI 34.60 kg/m  General:   Alert and oriented. No distress noted. Pleasant and cooperative.  Head:  Normocephalic and atraumatic. Eyes:  Conjuctiva clear without scleral icterus. Heart:  S1, S2 present without murmurs appreciated. Lungs:  Clear to auscultation bilaterally. No wheezes, rales, or rhonchi. No distress.  Abdomen:  +BS, soft, non-tender and non-distended. No rebound or guarding. No HSM or masses noted. Msk:  Symmetrical without gross deformities. Normal posture. Extremities:  Without edema. Neurologic:  Alert and  oriented x4 Psych: Normal mood and affect.

## 2019-01-26 ENCOUNTER — Other Ambulatory Visit: Payer: Self-pay

## 2019-01-26 ENCOUNTER — Encounter: Payer: Self-pay | Admitting: Gastroenterology

## 2019-01-26 ENCOUNTER — Ambulatory Visit (INDEPENDENT_AMBULATORY_CARE_PROVIDER_SITE_OTHER): Payer: BC Managed Care – PPO | Admitting: Gastroenterology

## 2019-01-26 VITALS — BP 145/90 | HR 72 | Temp 97.6°F | Ht 64.0 in | Wt 201.6 lb

## 2019-01-26 DIAGNOSIS — K5900 Constipation, unspecified: Secondary | ICD-10-CM | POA: Diagnosis not present

## 2019-01-26 DIAGNOSIS — R1310 Dysphagia, unspecified: Secondary | ICD-10-CM | POA: Diagnosis not present

## 2019-01-26 DIAGNOSIS — R140 Abdominal distension (gaseous): Secondary | ICD-10-CM | POA: Diagnosis not present

## 2019-01-26 DIAGNOSIS — K219 Gastro-esophageal reflux disease without esophagitis: Secondary | ICD-10-CM | POA: Diagnosis not present

## 2019-01-26 NOTE — Assessment & Plan Note (Addendum)
Well-controlled on Protonix 40 mg daily.  Only with reflux symptoms with orange juice.  No other significant upper GI symptoms.  No alarm symptoms.  Continue Protonix 40 mg daily 30 minutes before breakfast.  Advise she continue to limit naproxen as much as possible and avoid all other NSAIDs.  Follow-up in 3 months.

## 2019-01-26 NOTE — Assessment & Plan Note (Signed)
Much improved/resolved since cholecystectomy in July 2020.

## 2019-01-26 NOTE — Assessment & Plan Note (Addendum)
Chronic constipation.  Intolerant to Linzess with diarrhea even at low doses.  Amitiza 24 mcg once daily caused diarrhea.  Was going to try Amitiza 8 mcg twice daily but this was too expensive and [facility] requested patient try Motegrity.  Currently taking Motegrity daily and having 1 loose BM daily.  Sometimes watery.  Feels BMs are complete and is happy with how Motegrity is working. She is s/p cholecystectomy in July 2020 and reports looser stools with urgency in the setting of meals with high fat content.  She does report some stool seepage about 3 times a week if coughing or sneezing.  Seems to be more closely associated with loose stool/urgency in the setting of meals with high fat content.  No alarm symptoms.  Colonoscopy up-to-date in 2018 and due for repeat in 2028.  Suspect seepage is likely related to stool consistency.  She was advised to follow a low-fat diet, add Benefiber or Metamucil to help with stool consistency, and try taking Motegrity every other day to limit loose stools but allow BMs daily.  If daily fiber supplement worsened diarrhea, she is to discontinue. Follow-up in 3 months.

## 2019-01-26 NOTE — Assessment & Plan Note (Addendum)
History of dysphagia with dilation in 2018.  Patient continued with some symptoms and underwent BPE in April 2019 that was unrevealing.  Also underwent ultrasound of her neck to evaluate her thyroid in April 2019 with normal-sized thyroid with single small isthmic nodule which does not meet criteria for biopsy or dedicated imaging follow-up. Currently symptoms are well controlled/resolved.  GERD is well controlled on Protonix 40 mg daily.  She is advised to continue current medications and monitor symptoms.

## 2019-01-26 NOTE — Patient Instructions (Addendum)
I suspect the stool seepage is likely related to your stools being on the looser side.   As you have recently had your gallbladder removed, when eating fatty meals, you are likely going to have diarrhea and associated urgency.  I would recommend that you follow a low-fat diet as this will also likely help stool consistency and limit seepage.  Please add Benefiber or Metamucil daily to see if this helps with stool consistency.  If this worsens diarrhea, you may stop taking the fiber supplement.  You may try taking Motegrity every other day to see if this helps limit loose stools but allows you to continue having a BM daily.  Continue taking Protonix 40 mg 30 minutes before breakfast.  Continue to limit naproxen use as much as possible and avoid all other NSAIDs including ibuprofen, Advil, and Goody powders.  We will plan to follow-up with you in 3 months.  You may call if you have questions or concerns prior.  Aliene Altes, PA-C [facility]

## 2019-03-06 DIAGNOSIS — R6889 Other general symptoms and signs: Secondary | ICD-10-CM | POA: Diagnosis not present

## 2019-03-27 DIAGNOSIS — U071 COVID-19: Secondary | ICD-10-CM | POA: Diagnosis not present

## 2019-04-27 ENCOUNTER — Ambulatory Visit: Payer: BC Managed Care – PPO | Admitting: Gastroenterology

## 2019-05-07 DIAGNOSIS — E049 Nontoxic goiter, unspecified: Secondary | ICD-10-CM | POA: Diagnosis not present

## 2019-05-07 DIAGNOSIS — Z79899 Other long term (current) drug therapy: Secondary | ICD-10-CM | POA: Diagnosis not present

## 2019-05-07 DIAGNOSIS — E559 Vitamin D deficiency, unspecified: Secondary | ICD-10-CM | POA: Diagnosis not present

## 2019-05-07 DIAGNOSIS — E782 Mixed hyperlipidemia: Secondary | ICD-10-CM | POA: Diagnosis not present

## 2019-05-15 DIAGNOSIS — R739 Hyperglycemia, unspecified: Secondary | ICD-10-CM | POA: Diagnosis not present

## 2019-05-15 DIAGNOSIS — I10 Essential (primary) hypertension: Secondary | ICD-10-CM | POA: Diagnosis not present

## 2019-05-15 DIAGNOSIS — E782 Mixed hyperlipidemia: Secondary | ICD-10-CM | POA: Diagnosis not present

## 2019-05-15 DIAGNOSIS — Z713 Dietary counseling and surveillance: Secondary | ICD-10-CM | POA: Diagnosis not present

## 2019-06-25 DIAGNOSIS — R202 Paresthesia of skin: Secondary | ICD-10-CM | POA: Diagnosis not present

## 2019-06-25 DIAGNOSIS — G35 Multiple sclerosis: Secondary | ICD-10-CM | POA: Diagnosis not present

## 2019-07-10 DIAGNOSIS — E669 Obesity, unspecified: Secondary | ICD-10-CM | POA: Diagnosis not present

## 2019-07-10 DIAGNOSIS — G4700 Insomnia, unspecified: Secondary | ICD-10-CM | POA: Diagnosis not present

## 2019-07-10 DIAGNOSIS — F3341 Major depressive disorder, recurrent, in partial remission: Secondary | ICD-10-CM | POA: Diagnosis not present

## 2019-07-14 DIAGNOSIS — M545 Low back pain: Secondary | ICD-10-CM | POA: Diagnosis not present

## 2019-07-14 DIAGNOSIS — M792 Neuralgia and neuritis, unspecified: Secondary | ICD-10-CM | POA: Diagnosis not present

## 2019-08-23 DIAGNOSIS — G35 Multiple sclerosis: Secondary | ICD-10-CM | POA: Diagnosis not present

## 2019-08-23 DIAGNOSIS — R918 Other nonspecific abnormal finding of lung field: Secondary | ICD-10-CM | POA: Diagnosis not present

## 2019-08-24 DIAGNOSIS — G35 Multiple sclerosis: Secondary | ICD-10-CM | POA: Diagnosis not present

## 2019-08-25 IMAGING — RF DG ESOPHAGUS
9 of 10 series · 15 of 24 positions shown · non-contrast
Comparison: Esophageal distention: Normal without mass or stricture

CLINICAL DATA: Oro pharyngeal dysphagia, intermittent difficulty
swallowing solids and liquids, prior age EGD with dilatation of
esophageal stricture without improvement in symptoms, food sticks
intermittently for past 2-3 years

EXAM:
ESOPHOGRAM / BARIUM SWALLOW / BARIUM TABLET STUDY
TECHNIQUE: Combined double contrast and single contrast examination performed
using effervescent crystals, thick barium liquid, and thin barium
liquid. The patient was observed with fluoroscopy swallowing a 13 mm
barium sulphate tablet.
FLUOROSCOPY TIME:  Fluoroscopy Time:  1 minutes 36 seconds
Radiation Exposure Index (if provided by the fluoroscopic device):
30.2 mGy
Number of Acquired Spot Images: multiple fluoroscopic screen
captures

[Series 1: cp_standard · 0.18mm/px · 2 of 62 frames shown (1 of 9)]
[frame 2/62]
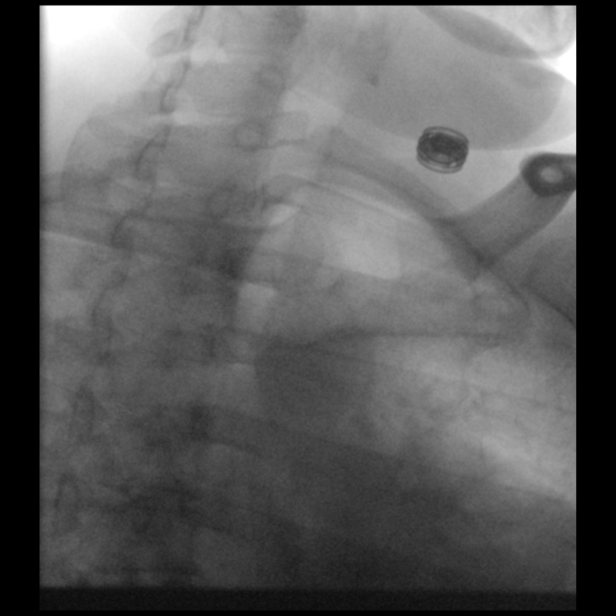
[frame 53/62]
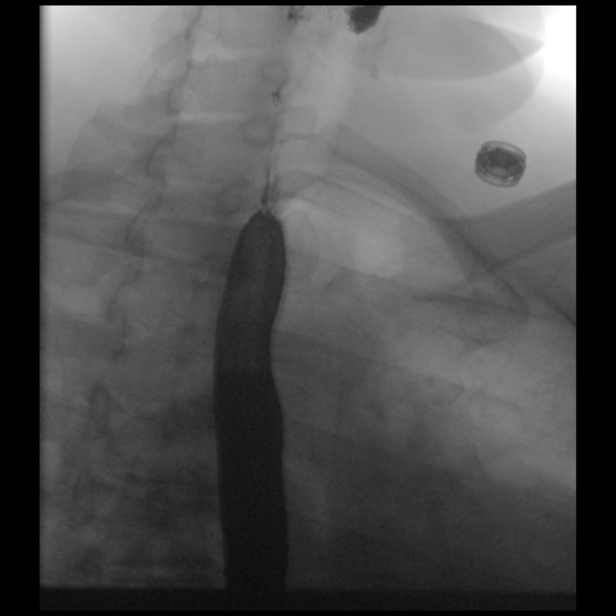

[Series 3: cp_standard · 0.18mm/px · 2 of 32 frames shown (2 of 9)]
[frame 2/32]
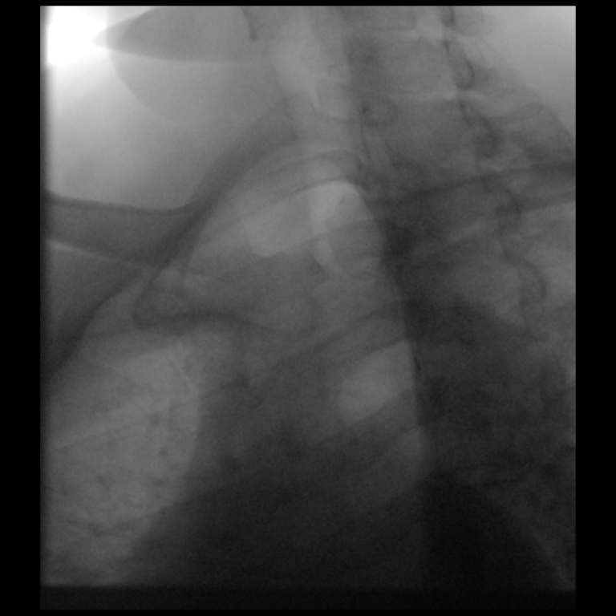
[frame 5/32]
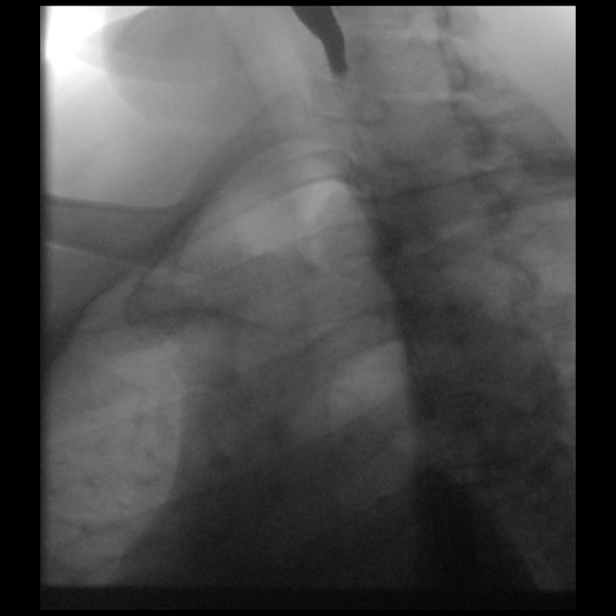

[Series 4: cp_standard · 0.18mm/px · 2 of 93 frames shown (3 of 9)]
[frame 14/93]
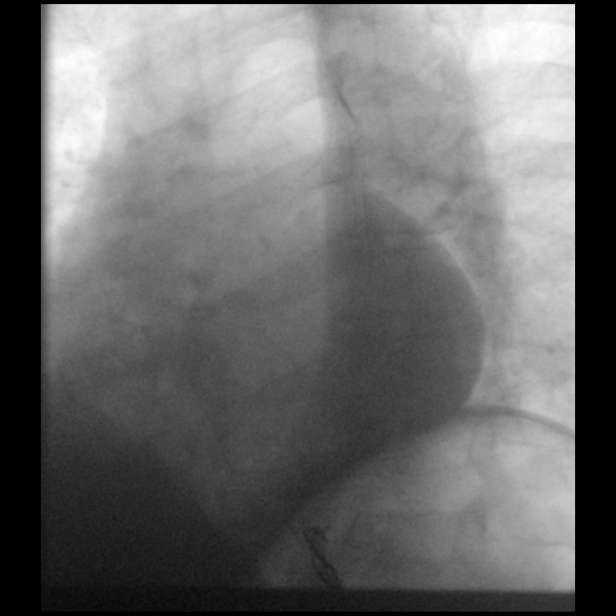
[frame 47/93]
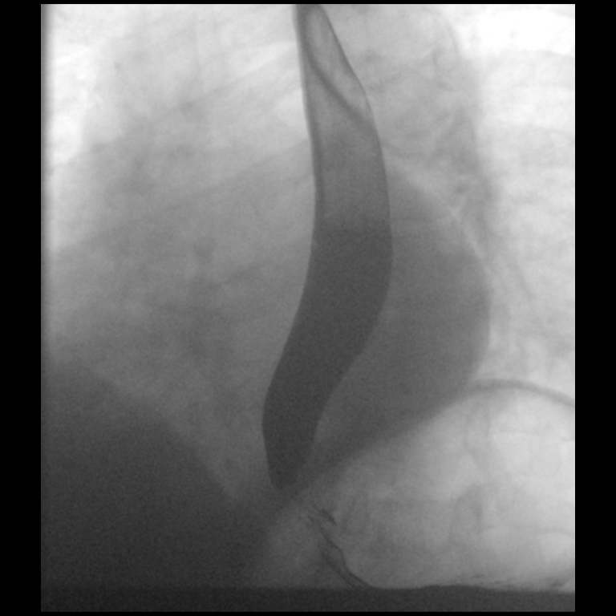

[Series 5: cp_standard · 0.27mm/px · 1 of 180 frames shown (4 of 9)]
[frame 91/180]
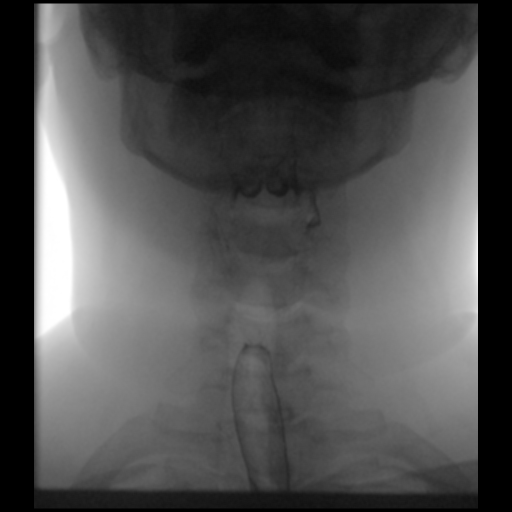

[Series 6: cp_standard · 0.26mm/px · 2 of 198 frames shown (5 of 9)]
[frame 92/198]
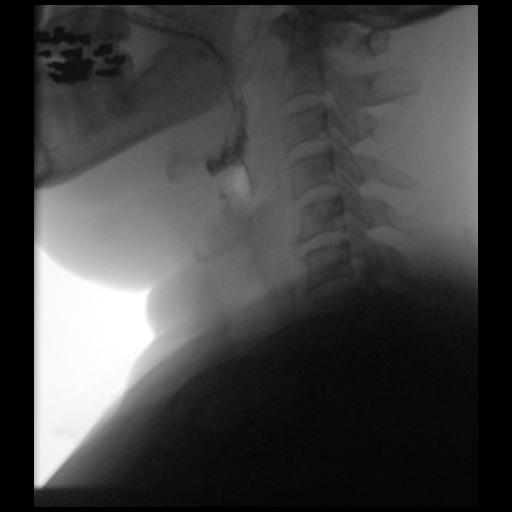
[frame 100/198]
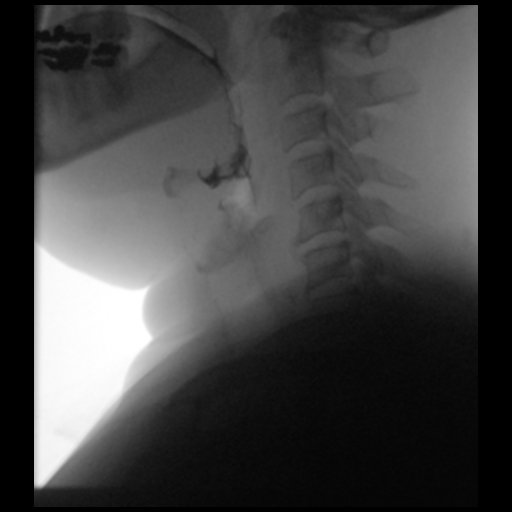

[Series 7: cp_standard · 0.19mm/px · 2 of 8 frames shown (6 of 9)]
[frame 7/8]
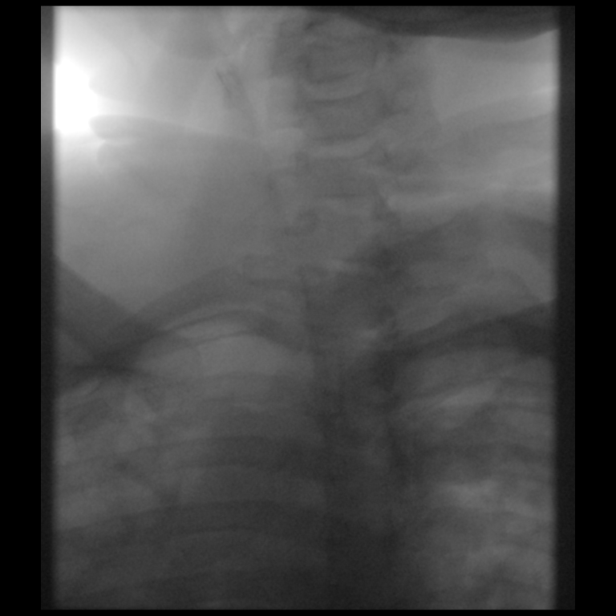
[frame 8/8]
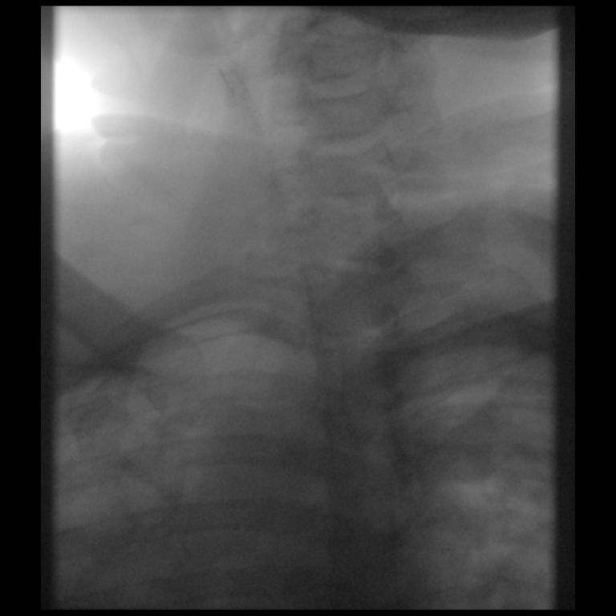

[Series 8: cp_standard · 0.19mm/px · 1 of 92 frames shown (7 of 9)]
[frame 79/92]
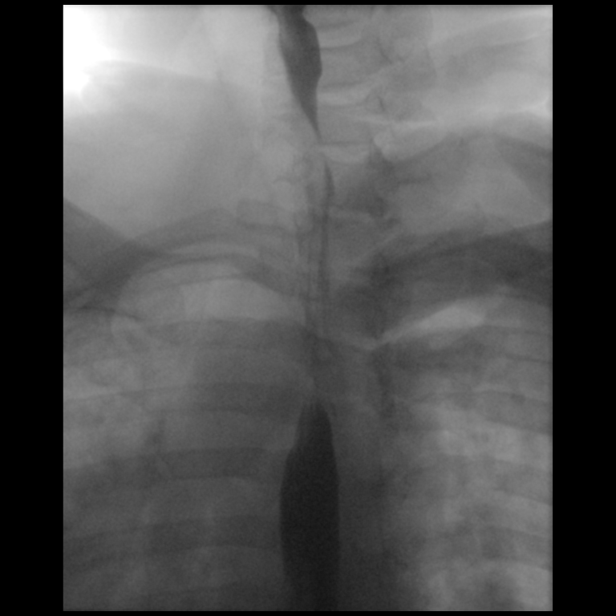

[Series 9: cp_standard · 0.19mm/px · 1 of 48 frames shown (8 of 9)]
[frame 25/48]
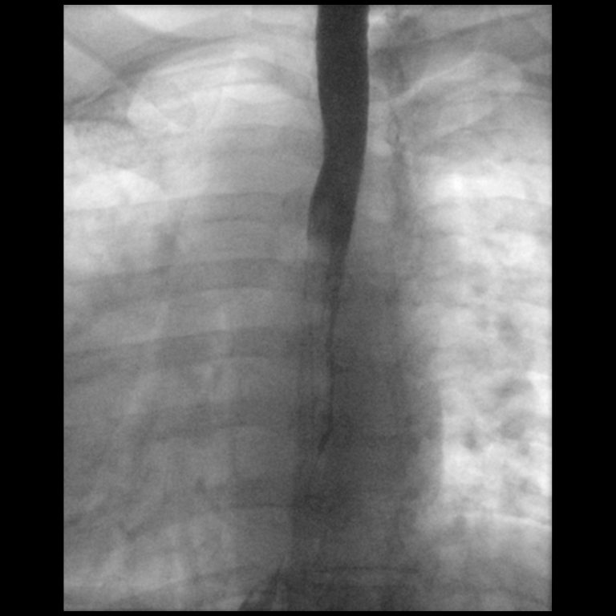

[Series 10: cp_standard · 0.19mm/px · 2 of 66 frames shown (9 of 9)]
[frame 10/66]
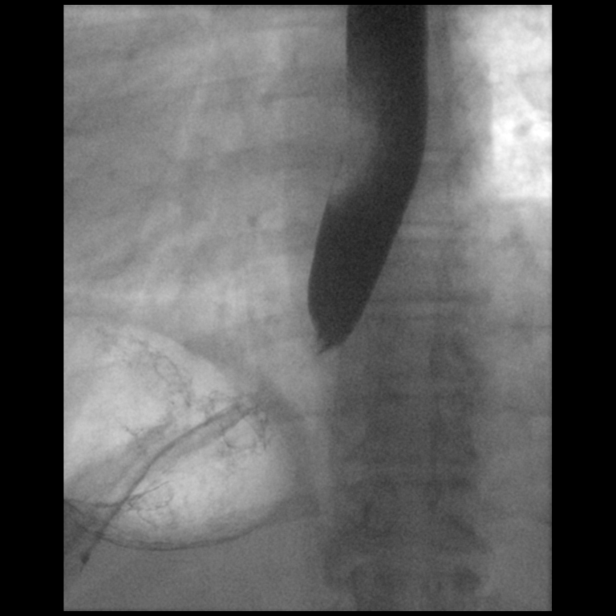
[frame 64/66]
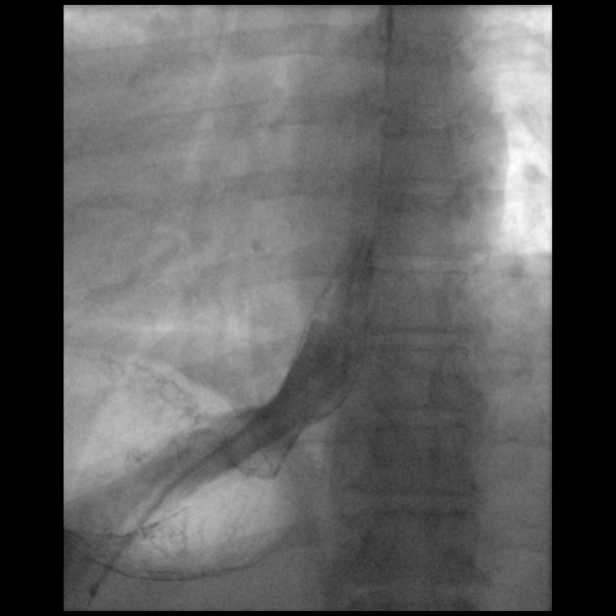

[15 of 24 positions shown; findings below may reference images not displayed]

Filling defects:  None

12.5 mm barium tablet: Easily passed from oral cavity to stomach
without delay

Motility:  Normal

Mucosa:  Smooth without irregularity or ulceration

Hypopharynx/cervical esophagus: Minimal premature spillover to the
vallecula. No laryngeal penetration or aspiration. Minimal
vallecular residuals cleared by a spontaneous second swallow

Hiatal hernia:  Absent

GE reflux:  Not witnessed during exam.

Other:  N/A
FINDINGS: Minimal premature spillover of oral contrast from the oral cavity to
the vallecula and minimal vallecular residuals.

Otherwise negative exam.

## 2019-09-14 DIAGNOSIS — E669 Obesity, unspecified: Secondary | ICD-10-CM | POA: Diagnosis not present

## 2019-09-14 DIAGNOSIS — Z6837 Body mass index (BMI) 37.0-37.9, adult: Secondary | ICD-10-CM | POA: Diagnosis not present

## 2019-09-28 ENCOUNTER — Encounter: Payer: Self-pay | Admitting: Internal Medicine

## 2019-09-28 ENCOUNTER — Ambulatory Visit (INDEPENDENT_AMBULATORY_CARE_PROVIDER_SITE_OTHER): Payer: BC Managed Care – PPO | Admitting: Internal Medicine

## 2019-09-28 ENCOUNTER — Other Ambulatory Visit: Payer: Self-pay

## 2019-09-28 ENCOUNTER — Ambulatory Visit (HOSPITAL_COMMUNITY)
Admission: RE | Admit: 2019-09-28 | Discharge: 2019-09-28 | Disposition: A | Discharge status: Home / Self Care | Payer: BC Managed Care – PPO | Source: Ambulatory Visit | Attending: Internal Medicine | Admitting: Internal Medicine

## 2019-09-28 VITALS — BP 114/78 | HR 77 | Temp 97.8°F | Ht 64.0 in | Wt 221.0 lb

## 2019-09-28 DIAGNOSIS — R0602 Shortness of breath: Secondary | ICD-10-CM | POA: Diagnosis not present

## 2019-09-28 DIAGNOSIS — R0600 Dyspnea, unspecified: Secondary | ICD-10-CM | POA: Diagnosis not present

## 2019-09-28 DIAGNOSIS — R0609 Other forms of dyspnea: Secondary | ICD-10-CM

## 2019-09-28 MED ORDER — PANTOPRAZOLE SODIUM 40 MG PO TBEC: DELAYED_RELEASE_TABLET | ORAL | 3 refills | Status: AC

## 2019-09-28 MED ORDER — ALBUTEROL SULFATE HFA 108 (90 BASE) MCG/ACT IN AERS: INHALATION_SPRAY | RESPIRATORY_TRACT | 1 refills | Status: DC

## 2019-09-28 NOTE — Patient Instructions (Addendum)
Protonix 40 mg Take 30- 60 min before your first and last meals of the day   GERD (REFLUX)  is an extremely common cause of respiratory symptoms just like yours , many times with no obvious heartburn at all.    It can be treated with medication, but also with lifestyle changes including elevation of the head of your bed (ideally with 6 -8inch blocks under the headboard of your bed),  Smoking cessation, avoidance of late meals, excessive alcohol, and avoid fatty foods, chocolate, peppermint, colas, red wine, and acidic juices such as orange juice.  NO MINT OR MENTHOL PRODUCTS SO NO COUGH DROPS  USE SUGARLESS CANDY INSTEAD (Jolley ranchers or Stover's or Life Savers) or even ice chips will also do - the key is to swallow to prevent all throat clearing. NO OIL BASED VITAMINS - use powdered substitutes.  Avoid fish oil when coughing.   To get the most out of exercise, you need to be continuously aware that you are short of breath, but never out of breath, for at least 30 minutes daily. As you improve, it will actually be easier for you to do the same amount of exercise  in  30 minutes so always push to the level where you are short of breath.   Please remember to go to the  x-ray department  @  [facility] for your tests - we will call you with the results when they are available     Try albuterol 15 min before an activity that you know would make you short of breath and see if it makes any difference and if makes none then don't take it after activity unless you can't catch your breath.      Please schedule a follow up visit in 3 months with pfts on return  but call sooner if needed

## 2019-09-28 NOTE — Progress Notes (Signed)
Madison Oliver, female    DOB: 1970-10-31,    MRN: [id number]   Brief patient profile:  25 yobf never smoker with  GERD/Barrett's  MS dx 2008 (baseline 189)   Weak legs but did steps fine then Covid 24 Feb 2019 with diarrhea/ cough/ short of breath  rx with albuterol helped some and got both shots of Moderna in March 2021 but breathing never back to baseline so eval by cards > apparently neg w/u and referred to pulmonary clinic in [facility]  09/28/2019 by Cheron Every NP      History of Present Illness  09/28/2019  Pulmonary/ 1st office eval/ William Laske / [facility]  Chief Complaint  Patient presents with  . Pulmonary Consult    Referred by Dr. Mayford Knife. Pt c/o DOE since had Covid 24 Feb 2019. She was winded walking from lobby to exam room today. She states that occ at night when she lies down she can hear wheezing.   Dyspnea:  50 ft doe but also legs get tired about the same time (see walk in clinic x 500 ft)  Cough: at hs with overt gag/ vomit  sssoc subj wheeze on protonix 40 mg pc qam  Sleep: no elevation/ 2 pillows on side  SABA use: none   Able to do water aerobics for an hour standing in chest high water   No obvious day to day or daytime variability or assoc purulent sputum or mucus plugs or hemoptysis or cp or chest tightness,  or overt sinus symptoms.   Sleeping  without nocturnal  or early am exacerbation  of respiratory  c/o's or need for noct saba. Also denies any obvious fluctuation of symptoms with weather or environmental changes or other aggravating or alleviating factors except as outlined above   No unusual exposure hx or h/o childhood pna/ asthma or knowledge of premature birth.  Current Allergies, Complete Past Medical History, Past Surgical History, Family History, and Social History were reviewed in [employer] record.  ROS  The following are not active complaints unless bolded Hoarseness, sore throat, dysphagia, dental problems,  itching, sneezing,  nasal congestion or discharge of excess mucus or purulent secretions, ear ache,   fever, chills, sweats, unintended wt loss or wt gain, classically pleuritic or exertional cp,  orthopnea pnd or arm/hand swelling  or leg swelling, presyncope, palpitations, abdominal pain, anorexia, nausea, vomiting, diarrhea  or change in bowel habits or change in bladder habits, change in stools or change in urine, dysuria, hematuria,  rash, arthralgias, visual complaints, headache, numbness, weakness or ataxia or problems with walking or coordination,  change in mood or  memory.             Past Medical History:  Diagnosis Date  . Barrett esophagus   . Chronic pain   . Depression   . GERD (gastroesophageal reflux disease)   . HTN (hypertension)   . Hypercholesterolemia   . MS (multiple sclerosis) (HCC)   . RLS (restless legs syndrome)   . Sleep apnea    not using CPAP; cannot tolerate, PCP not aware.    Outpatient Medications Prior to Visit  Medication Sig Dispense Refill  . amantadine (SYMMETREL) 100 MG capsule Take 100 mg by mouth 2 (two) times daily.    . ARIPiprazole (ABILIFY) 5 MG tablet Take 5 mg by mouth daily.    . baclofen (LIORESAL) 10 MG tablet Take 20 mg by mouth 3 (three) times daily.    . carvedilol (COREG)  6.25 MG tablet Take 6.25 mg by mouth 2 (two) times daily with a meal.    . citalopram (CELEXA) 20 MG tablet Take 20 mg by mouth at bedtime.    . Dimethyl Fumarate (TECFIDERA) 240 MG CPDR Take 240 mg by mouth 2 (two) times daily.     . diphenhydrAMINE (BENADRYL) 25 MG tablet Take 25 mg by mouth every 6 (six) hours as needed for allergies.    . hydrochlorothiazide (HYDRODIURIL) 25 MG tablet Take 25 mg by mouth daily.    Marland Kitchen HYDROcodone-acetaminophen (NORCO) 5-325 MG tablet Take 1 tablet by mouth every 4 (four) hours as needed for moderate pain. 30 tablet 0  . losartan (COZAAR) 100 MG tablet Take 100 mg by mouth daily.    . mirabegron ER (MYRBETRIQ) 25 MG TB24 tablet  Take 25 mg by mouth daily.    . naproxen (NAPROSYN) 500 MG tablet Take 500 mg by mouth 2 (two) times daily as needed for moderate pain.     . pantoprazole (PROTONIX) 40 MG tablet Take 1 tablet (40 mg total) by mouth daily. 30 minutes before breakfast 90 tablet 3  . Polyethyl Glycol-Propyl Glycol (LUBRICANT EYE DROPS) 0.4-0.3 % SOLN Place 1-2 drops into both eyes 3 (three) times daily as needed (for dry eyes.).    Marland Kitchen Pregabalin ER (LYRICA CR) 165 MG TB24 Take 165 mg by mouth 3 (three) times daily.    . simvastatin (ZOCOR) 40 MG tablet Take 40 mg by mouth daily.    . traZODone (DESYREL) 100 MG tablet Take 50 mg by mouth at bedtime.    Marland Kitchen VICTOZA 18 MG/3ML SOPN Inject 1.8 mg into the skin daily.    . Vitamin D, Ergocalciferol, (DRISDOL) 1.25 MG (50000 UT) CAPS capsule Take 50,000 Units by mouth every Tuesday.    . Prucalopride Succinate (MOTEGRITY) 2 MG TABS Take 1 tablet by mouth daily. (Patient not taking: Reported on 09/28/2019) 30 tablet 3   No facility-administered medications prior to visit.     Objective:     BP 114/78 (BP Location: Left Arm, Cuff Size: Large)   Pulse 77   Temp 97.8 F (36.6 C) (Temporal)   Ht 5\' 4"  (1.626 m)   Wt 221 lb (100.2 kg)   SpO2 98% Comment: on RA  BMI 37.93 kg/m   SpO2: 98 % (on RA)   Obese somber bf nad    HEENT : pt wearing mask not removed for exam due to covid -19 concerns.    NECK :  without JVD/Nodes/TM/ nl carotid upstrokes bilaterally   LUNGS: no acc muscle use,  Nl contour chest which is clear to A and P bilaterally without cough on insp or exp maneuvers   CV:  RRR  no s3 or murmur or increase in P2, and no edema   ABD: obese  soft and nontender with slt reduced  inspiratory excursion in the supine position. No bruits or organomegaly appreciated, bowel sounds nl  MS:  Nl gait/ ext warm without deformities, calf tenderness, cyanosis or clubbing No obvious joint restrictions   SKIN: warm and dry without lesions    NEURO:  alert,  approp, nl sensorium with  no motor or cerebellar deficits apparent.    CXR PA and Lateral:   09/28/2019 :    I personally reviewed images and  impression as follows:   Decreased lung volumes / min nonspecific markings      Assessment   No problem-specific Assessment & Plan notes found for this encounter.  Sandrea Hughs, MD 09/28/2019

## 2019-09-28 NOTE — Assessment & Plan Note (Addendum)
Onset Jan 2021 p covid rx as outpt with pred/ abx/ saba  -  09/28/2019   Walked RA  approx   500 ft  @ moderate pace  stopped due to end of study, min sob with sats 98%   Symptoms are markedly disproportionate to objective findings and not clear to what extent this is actually a pulmonary  problem but pt does appear to have difficult to sort out respiratory symptoms of unknown origin for which  DDX  = almost all start with A and  include Adherence, Ace Inhibitors, Acid Reflux, Active Sinus Disease, Alpha 1 Antitripsin deficiency, Anxiety masquerading as Airways dz,  ABPA,  Allergy(esp in young), Aspiration (esp in elderly), Adverse effects of meds,  Active smoking or Vaping, A bunch of PE's/clot burden (a few small clots can't cause this syndrome unless there is already severe underlying pulm or vascular dz with poor reserve),  Anemia or thyroid disorder, plus two Bs  = Bronchiectasis and Beta blocker use..and one C= CHF    Adherence is always the initial "prime suspect" and is a multilayered concern that requires a "trust but verify" approach in every patient - starting with knowing how to use medications, especially inhalers, correctly, keeping up with refills and understanding the fundamental difference between maintenance and prns vs those medications only taken for a very short course and then stopped and not refilled.  - reviewed optimal technique for HFA  ? Acid (or non-acid) GERD > always difficult to exclude as up to 75% of pts in some series report no assoc GI/ Heartburn symptoms> rec max (24h)  acid suppression and diet restrictions/ reviewed and instructions given in writing.   ? Allergy /asthma > for now just use saba prn I spent extra time with pt today reviewing appropriate use of albuterol for prn use on exertion with the following points: 1) saba is for relief of sob that does not improve by walking a slower pace or resting but rather if the pt does not improve after trying this  first. 2) If the pt is convinced, as many are, that saba helps recover from activity faster then it's easy to tell if this is the case by re-challenging : ie stop, take the inhaler, then p 5 minutes try the exact same activity (intensity of workload) that just caused the symptoms and see if they are substantially diminished or not after saba 3) if there is an activity that reproducibly causes the symptoms, try the saba 15 min before the activity on alternate days   If in fact the saba really does help, then fine to continue to use it prn but advised may need to look closer at the maintenance regimen being used to achieve better control of airways disease with exertion.    ? Anxiety/depression/ deconditioning  usually at the bottom of this list of usual suspects but should be included  on this pt's based on H and P and note already on psychotropics and may interfere with adherence and also interpretation of response or lack thereof to symptom management which can be quite subjective.    ? Adverse drug effects > none of the usual suspects listed    ? Anemia/ thyroid dz > defer to PCP    ? A bunch of PE's > very unlikely based on today's ex  Study   ? chf > nothing to suggest           Each maintenance medication was reviewed in detail including emphasizing most  importantly the difference between maintenance and prns and under what circumstances the prns are to be triggered using an action plan format where appropriate.  Total time for H and P, chart review, counseling, teaching hfa device and  directly observing portions of ambulatory 02 saturation study/  and generating customized AVS unique to this office visit / charting = 45 min

## 2019-09-29 NOTE — Progress Notes (Signed)
Spoke with pt and notified of results per Dr. Wert. Pt verbalized understanding and denied any questions. 

## 2019-11-05 DIAGNOSIS — R739 Hyperglycemia, unspecified: Secondary | ICD-10-CM | POA: Diagnosis not present

## 2019-11-05 DIAGNOSIS — E049 Nontoxic goiter, unspecified: Secondary | ICD-10-CM | POA: Diagnosis not present

## 2019-11-05 DIAGNOSIS — E559 Vitamin D deficiency, unspecified: Secondary | ICD-10-CM | POA: Diagnosis not present

## 2019-11-05 DIAGNOSIS — E782 Mixed hyperlipidemia: Secondary | ICD-10-CM | POA: Diagnosis not present

## 2019-11-05 DIAGNOSIS — Z79899 Other long term (current) drug therapy: Secondary | ICD-10-CM | POA: Diagnosis not present

## 2019-11-09 DIAGNOSIS — Z6830 Body mass index (BMI) 30.0-30.9, adult: Secondary | ICD-10-CM | POA: Diagnosis not present

## 2019-11-09 DIAGNOSIS — E669 Obesity, unspecified: Secondary | ICD-10-CM | POA: Diagnosis not present

## 2019-11-17 DIAGNOSIS — I10 Essential (primary) hypertension: Secondary | ICD-10-CM | POA: Diagnosis not present

## 2019-11-17 DIAGNOSIS — M549 Dorsalgia, unspecified: Secondary | ICD-10-CM | POA: Diagnosis not present

## 2019-11-17 DIAGNOSIS — Z23 Encounter for immunization: Secondary | ICD-10-CM | POA: Diagnosis not present

## 2019-11-17 DIAGNOSIS — Z6841 Body Mass Index (BMI) 40.0 and over, adult: Secondary | ICD-10-CM | POA: Diagnosis not present

## 2019-11-17 DIAGNOSIS — G35 Multiple sclerosis: Secondary | ICD-10-CM | POA: Diagnosis not present

## 2019-11-30 DIAGNOSIS — M5416 Radiculopathy, lumbar region: Secondary | ICD-10-CM | POA: Diagnosis not present

## 2019-11-30 DIAGNOSIS — Z6837 Body mass index (BMI) 37.0-37.9, adult: Secondary | ICD-10-CM | POA: Diagnosis not present

## 2019-12-18 ENCOUNTER — Other Ambulatory Visit (HOSPITAL_COMMUNITY)
Admission: RE | Admit: 2019-12-18 | Discharge: 2019-12-18 | Disposition: A | Discharge status: Home / Self Care | Payer: BC Managed Care – PPO | Source: Ambulatory Visit | Attending: Internal Medicine | Admitting: Internal Medicine

## 2019-12-18 ENCOUNTER — Other Ambulatory Visit: Payer: Self-pay

## 2019-12-18 DIAGNOSIS — Z20822 Contact with and (suspected) exposure to covid-19: Secondary | ICD-10-CM | POA: Diagnosis not present

## 2019-12-18 DIAGNOSIS — Z01812 Encounter for preprocedural laboratory examination: Secondary | ICD-10-CM | POA: Diagnosis not present

## 2019-12-18 LAB — SARS CORONAVIRUS 2 (TAT 6-24 HRS): SARS Coronavirus 2: NEGATIVE

## 2019-12-22 ENCOUNTER — Ambulatory Visit (HOSPITAL_COMMUNITY)
Admission: RE | Admit: 2019-12-22 | Discharge: 2019-12-22 | Disposition: A | Discharge status: Home / Self Care | Payer: BC Managed Care – PPO | Source: Ambulatory Visit | Attending: Internal Medicine | Admitting: Internal Medicine

## 2019-12-22 ENCOUNTER — Other Ambulatory Visit: Payer: Self-pay

## 2019-12-22 DIAGNOSIS — R0600 Dyspnea, unspecified: Secondary | ICD-10-CM | POA: Diagnosis not present

## 2019-12-22 DIAGNOSIS — R0609 Other forms of dyspnea: Secondary | ICD-10-CM

## 2019-12-22 LAB — PULMONARY FUNCTION TEST
DL/VA % pred: 114 %
DL/VA: 4.95 ml/min/mmHg/L
DLCO unc % pred: 110 %
DLCO unc: 23.37 ml/min/mmHg
FEF 25-75 Post: 2.85 L/sec
FEF 25-75 Pre: 2.4 L/sec
FEF2575-%Change-Post: 19 %
FEF2575-%Pred-Post: 114 %
FEF2575-%Pred-Pre: 96 %
FEV1-%Change-Post: 4 %
FEV1-%Pred-Post: 98 %
FEV1-%Pred-Pre: 94 %
FEV1-Post: 2.32 L
FEV1-Pre: 2.23 L
FEV1FVC-%Change-Post: 2 %
FEV1FVC-%Pred-Pre: 101 %
FEV6-%Change-Post: 1 %
FEV6-%Pred-Post: 96 %
FEV6-%Pred-Pre: 95 %
FEV6-Post: 2.73 L
FEV6-Pre: 2.7 L
FEV6FVC-%Pred-Post: 102 %
FEV6FVC-%Pred-Pre: 102 %
FVC-%Change-Post: 1 %
FVC-%Pred-Post: 93 %
FVC-%Pred-Pre: 92 %
FVC-Post: 2.73 L
FVC-Pre: 2.7 L
Post FEV1/FVC ratio: 85 %
Post FEV6/FVC ratio: 100 %
Pre FEV1/FVC ratio: 83 %
Pre FEV6/FVC Ratio: 100 %
RV % pred: 94 %
RV: 1.67 L
TLC % pred: 96 %
TLC: 4.87 L

## 2019-12-22 MED ORDER — ALBUTEROL SULFATE (2.5 MG/3ML) 0.083% IN NEBU
2.5000 mg | INHALATION_SOLUTION | Freq: Once | RESPIRATORY_TRACT | Status: AC
  Administered 2019-12-22: 2.5 mg via RESPIRATORY_TRACT

## 2019-12-29 NOTE — Progress Notes (Signed)
LMTCB

## 2019-12-30 ENCOUNTER — Telehealth: Payer: Self-pay | Admitting: Internal Medicine

## 2019-12-30 NOTE — Telephone Encounter (Signed)
LMTC  X 1  

## 2019-12-30 NOTE — Progress Notes (Signed)
LMTCB

## 2020-01-04 NOTE — Telephone Encounter (Signed)
[phone] returning call

## 2020-01-04 NOTE — Telephone Encounter (Signed)
Spoke with pt and reviewed PFT results per Dr Sherene Sires. Pt verbalized understanding. Scheduled appt with Dr Sherene Sires 01/19/20 10:00. Nothing further needed.

## 2020-01-19 ENCOUNTER — Ambulatory Visit (INDEPENDENT_AMBULATORY_CARE_PROVIDER_SITE_OTHER): Payer: BC Managed Care – PPO | Admitting: Internal Medicine

## 2020-01-19 ENCOUNTER — Other Ambulatory Visit: Payer: Self-pay

## 2020-01-19 ENCOUNTER — Encounter: Payer: Self-pay | Admitting: Internal Medicine

## 2020-01-19 DIAGNOSIS — K219 Gastro-esophageal reflux disease without esophagitis: Secondary | ICD-10-CM | POA: Diagnosis not present

## 2020-01-19 DIAGNOSIS — R0600 Dyspnea, unspecified: Secondary | ICD-10-CM | POA: Diagnosis not present

## 2020-01-19 DIAGNOSIS — R0609 Other forms of dyspnea: Secondary | ICD-10-CM

## 2020-01-19 NOTE — Assessment & Plan Note (Signed)
clnical dx only - marked improvement in cough/ gag on ppi ac   >> add pepcid 20 mg at hs if flares noct despite diet/ wt loss/ bed blocks rec and f/u with GI prn          Each maintenance medication was reviewed in detail including emphasizing most importantly the difference between maintenance and prns and under what circumstances the prns are to be triggered using an action plan format where appropriate.  Total time for H and P, chart review, counseling,   and generating customized AVS unique to this office visit / charting = 29 min

## 2020-01-19 NOTE — Assessment & Plan Note (Addendum)
Onset Jan 2021 p covid rx as outpt with pred/ abx/ saba  -  09/28/2019   Walked RA  approx   500 ft  @ moderate pace  stopped due to end of study, min sob with sats 98%  -  PFTs 12/25/19   Nl x for ERV 30% which is typical of her body habitus   Doubt asthma but hard to r/o.  Reviewed rule of 2s   I spent extra time with pt today reviewing appropriate use of albuterol for prn use on exertion with the following points: 1) saba is for relief of sob that does not improve by walking a slower pace or resting but rather if the pt does not improve after trying this first. 2) If the pt is convinced, as many are, that saba helps recover from activity faster then it's easy to tell if this is the case by re-challenging : ie stop, take the inhaler, then p 5 minutes try the exact same activity (intensity of workload) that just caused the symptoms and see if they are substantially diminished or not after saba 3) if there is an activity that reproducibly causes the symptoms, try the saba 15 min before the activity on alternate days   If in fact the saba really does help, then fine to continue to use it prn but advised may need to look closer at the maintenance regimen (in her case nothing)  being used to achieve better control of airways disease with exertion.

## 2020-01-19 NOTE — Patient Instructions (Addendum)
Try albuterol 15 min before an activity that you know would make you short of breath and see if it makes any difference and if makes none then don't take it after activity unless you can't catch your breath.   Weight control is simply a matter of calorie balance which needs to be tilted in your favor by eating less and exercising more.  To get the most out of exercise, you need to be continuously aware that you are short of breath, but never out of breath, for 30 minutes daily.    If any night time symptoms of cough or gag add pepcid 20 mg an hour before bed and consider bed blocks 6-8 inches   If you are not satisfied after holidays please call to schedule a CPST

## 2020-01-19 NOTE — Progress Notes (Signed)
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 Pulmonary Consult    Referred by Dr. Mayford Knife. Pt c/o DOE since had Covid 24 Feb 2019. She was winded walking from lobby to exam room today. She states that occ at night when she lies down she can hear wheezing.   Dyspnea:  50 ft doe but also legs get tired about the same time (see walk in clinic x 500 ft)  Cough: at hs with overt gag/ vomit  sssoc subj wheeze on protonix 40 mg pc qam  Sleep: no elevation/ 2 pillows on side  SABA use: none  Rec Protonix 40 mg Take 30- 60 min before your first and last meals of the day  GERD diet   To get the most out of exercise, you need to be continuously aware that you are short of breath  Try albuterol 15 min before an activity that you know would make you short of breath and see if it makes any difference and if makes none then don't take it after activity unless you can't catch your breath. Please schedule a follow up visit in 3 months with pfts > 12/25/19   Nl x for ERV 30%     01/19/2020  f/u ov/Madison Oliver office/Madison Oliver re: doe p covid/ no maint rx  Chief Complaint  Patient presents with  . Follow-up    Breathing has improved slightly since the last visit. She had PFT done 12/22/19. She is using her albuterol inhaler 3 x per wk on average.   Dyspnea:  No regular ex / still has problem with steps  Cough: better on ppi qa m   Sleeping: on side/ no elevation  SABA use: rarely  02: none    No obvious day to day or daytime variability or assoc excess/ purulent sputum or mucus plugs or hemoptysis or cp or chest tightness, subjective wheeze or overt sinus or hb symptoms.   Sleeping as above  without nocturnal  or early am exacerbation  of respiratory  c/o's or need for noct saba. Also denies any obvious fluctuation of symptoms with weather or environmental changes or other aggravating or alleviating factors except as outlined above   No unusual exposure hx or h/o childhood pna/ asthma or knowledge of premature birth.  Current Allergies, Complete Past Medical History, Past Surgical History, Family History, and Social History were reviewed in [employer] record.  ROS  The following are not active complaints unless bolded Hoarseness, sore throat, dysphagia, dental problems, itching, sneezing,  nasal congestion or discharge of excess mucus or purulent secretions, ear ache,   fever, chills, sweats, unintended wt loss or wt gain, classically pleuritic or exertional cp,  orthopnea pnd or arm/hand swelling  or leg swelling, presyncope, palpitations, abdominal pain, anorexia, nausea, vomiting, diarrhea  or change in bowel habits or  change in bladder habits, change in stools or change in urine, dysuria, hematuria,  rash, arthralgias, visual complaints, headache, numbness, weakness or ataxia or problems with walking or coordination,  change in mood or  memory.        Current Meds  Medication Sig  . albuterol (PROAIR HFA) 108 (90 Base) MCG/ACT inhaler 2 puffs Oliver 4 hours as needed only  if your can't catch your breath  . amantadine (SYMMETREL) 100 MG capsule Take 100 mg by mouth 2 (two) times daily.  . ARIPiprazole (ABILIFY) 5 MG tablet Take 5 mg by mouth daily.  . baclofen (LIORESAL) 10 MG tablet Take 20 mg by mouth 3 (three) times daily.  . carvedilol (COREG) 6.25 MG tablet Take 6.25 mg by mouth 2 (two)  times daily with a meal.  . citalopram (CELEXA) 20 MG tablet Take 20 mg by mouth at bedtime.  . Dimethyl Fumarate (TECFIDERA) 240 MG CPDR Take 240 mg by mouth 2 (two) times daily.   . diphenhydrAMINE (BENADRYL) 25 MG tablet Take 25 mg by mouth Oliver 6 (six) hours as needed for allergies.  . hydrochlorothiazide (HYDRODIURIL) 25 MG tablet Take 25 mg by mouth daily.  Marland Kitchen HYDROcodone-acetaminophen (NORCO) 5-325 MG tablet Take 1 tablet by mouth Oliver 4 (four) hours as needed for moderate pain.  Marland Kitchen losartan (COZAAR) 100 MG tablet Take 100 mg by mouth daily.  . mirabegron ER (MYRBETRIQ) 25 MG TB24 tablet Take 25 mg by mouth daily.  . naproxen (NAPROSYN) 500 MG tablet Take 500 mg by mouth 2 (two) times daily as needed for moderate pain.   . pantoprazole (PROTONIX) 40 MG tablet Take 30- 60 min before your first and last meals of the day  . Polyethyl Glycol-Propyl Glycol 0.4-0.3 % SOLN Place 1-2 drops into both eyes 3 (three) times daily as needed (for dry eyes.).  Marland Kitchen Pregabalin ER 165 MG TB24 Take 165 mg by mouth 3 (three) times daily.  . Prucalopride Succinate (MOTEGRITY) 2 MG TABS Take 1 tablet by mouth daily.  . simvastatin (ZOCOR) 40 MG tablet Take 40 mg by mouth daily.  . traZODone (DESYREL) 100 MG tablet Take 50 mg by mouth at bedtime.  Marland Kitchen VICTOZA 18 MG/3ML SOPN Inject 1.8 mg into the skin daily.  . Vitamin D, Ergocalciferol, (DRISDOL) 1.25 MG (50000 UT) CAPS capsule Take 50,000 Units by mouth Oliver Tuesday.                Past Medical History:  Diagnosis Date  . Barrett esophagus   . Chronic pain   . Depression   . GERD (gastroesophageal reflux disease)   . HTN (hypertension)   . Hypercholesterolemia   . MS (multiple sclerosis) (HCC)   . RLS (restless legs syndrome)   . Sleep apnea    not using CPAP; cannot tolerate, PCP not aware.       Objective:     Wt Readings from Last 3 Encounters:  01/19/20 222 lb (100.7 kg)  09/28/19 221 lb (100.2 kg)  01/26/19 201 lb 9.6 oz (91.4 kg)      Vital signs reviewed - Note on arrival 01/19/2020  02 sats  99% on RA       HEENT : pt wearing mask not removed for exam due to covid -19 concerns.    NECK :  without JVD/Nodes/TM/ nl carotid upstrokes bilaterally   LUNGS: no acc muscle use,  Nl contour chest which is clear to A and P bilaterally without cough on insp or exp  maneuvers   CV:  RRR  no s3 or murmur or increase in P2, and no edema   ABD:  Obese soft and nontender with nl inspiratory excursion in the supine position. No bruits or organomegaly appreciated, bowel sounds nl  MS:  Nl gait/ ext warm without deformities, calf tenderness, cyanosis or clubbing No obvious joint restrictions   SKIN: warm and dry without lesions    NEURO:  alert, approp, nl sensorium with  no motor or cerebellar deficits apparent.               Assessment

## 2020-10-03 ENCOUNTER — Other Ambulatory Visit: Payer: Self-pay

## 2020-10-03 ENCOUNTER — Telehealth: Payer: Self-pay | Admitting: Internal Medicine

## 2020-10-03 DIAGNOSIS — K648 Other hemorrhoids: Secondary | ICD-10-CM

## 2020-10-03 NOTE — Telephone Encounter (Signed)
Colonoscopy with internal hemorrhoids in past. Is she straining? Recommend CBC. If severe bleeding, dizzy, light-headed, worsening abdominal pain, go to ED.

## 2020-10-03 NOTE — Telephone Encounter (Signed)
Phoned the pt and was advised by her that she is not having to strain to have a BM. Also she advised me that on 08/23/20 she had blood work done by PCP and I looked it up and her Hgb was 12.7. she will have CBC done once she receives it from [place] in the mail. Advised the pt of the ED protocol and she agreed.

## 2020-10-03 NOTE — Telephone Encounter (Signed)
Returned the pt's call and was advised by her that yesterday she started passing blood whenever she had a bowel movement. Yesterday the blood was in the toilet and today it was in the toilet. The started feeling some nausea today. Weakness/fatigue is not different from any other day due to MS/medications for it. Abd pain left side and front of her abd being about a 6 off and on not all the time she stated. Her last bowel movement was today and it was normal. Pt last seen 2020. Please advise

## 2020-10-03 NOTE — Telephone Encounter (Signed)
Pt has OV in OCT and is aware she is on cancellation list. She is starting to pass blood. Please call [phone]

## 2020-10-03 NOTE — Telephone Encounter (Signed)
Phoned the pt and LMOVM 

## 2020-11-08 ENCOUNTER — Encounter: Payer: Self-pay | Admitting: Internal Medicine

## 2020-11-08 ENCOUNTER — Ambulatory Visit: Payer: BC Managed Care – PPO | Admitting: Gastroenterology

## 2021-11-13 DIAGNOSIS — I350 Nonrheumatic aortic (valve) stenosis: Secondary | ICD-10-CM | POA: Insufficient documentation

## 2022-04-05 ENCOUNTER — Ambulatory Visit: Payer: BC Managed Care – PPO | Admitting: Internal Medicine

## 2022-04-06 ENCOUNTER — Ambulatory Visit: Payer: BC Managed Care – PPO | Admitting: Internal Medicine

## 2022-05-01 NOTE — Progress Notes (Unsigned)
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 Jimmye Norman. Pt c/o DOE since had Covid 24 Feb 2019. She was winded walking from lobby to exam room today. She states that occ at night when she lies down she can hear wheezing.   Dyspnea:  50 ft doe but also legs get tired about the same time (see walk in clinic x 500 ft)  Cough: at hs with overt gag/ vomit  sssoc subj wheeze on protonix 40 mg pc qam  Sleep: no elevation/ 2 pillows on side  SABA use: none  Rec Protonix 40 mg Take 30- 60 min before your first and last meals of the day  GERD diet   To get the most out of exercise, you need to be continuously aware that you are short of breath  Try albuterol 15 min before an activity that you know would make you short of breath and see if it makes any difference and if makes none then don't take it after activity unless you can't catch your breath. Please schedule a follow up visit in 3 months with pfts > 12/25/19   Nl x for ERV 30%     01/19/2020  f/u ov/[place] office/Jahkari Maclin re: doe p covid/ no maint rx  Chief Complaint  Patient presents with   Follow-up    Breathing has improved slightly since the last visit. She had PFT done 12/22/19. She is using her albuterol inhaler 3 x per wk on average.   Dyspnea:  No regular ex / still has problem with steps  Cough: better on ppi qa m   Sleeping: on side/ no elevation  SABA use: rarely  02: none  Rec Try albuterol 15 min before an activity that you know would make you short of breath  Weight control is simply a matter of calorie balance If any night time symptoms of cough or gag add pepcid 20 mg an hour before bed and consider bed blocks 6-8 inches   If you are not satisfied after holidays please call to schedule a CPST > not done   05/02/2022  Re-establish ov/[place] office/Madison Oliver re: doe maint on no resp rx   Chief Complaint  Patient presents with   Follow-up    Questions about breathing pattern during sleep   Dyspnea:  improving and not needing much albuterol/ walking 30  min  twice weekly  Cough: none  Sleeping: level bed / 2 pillows  2 months worse wakening  SABA use: only p ex once a week   10-7am not rested/moderate sleepiness daytime never driving    No obvious day to day or daytime variability or assoc excess/ purulent sputum or mucus plugs or hemoptysis or cp or chest tightness, subjective wheeze or overt sinus or hb symptoms.    Also  denies any obvious fluctuation of symptoms with weather or environmental changes or other aggravating or alleviating factors except as outlined above   No unusual exposure hx or h/o childhood pna/ asthma or knowledge of premature birth.  Current Allergies, Complete Past Medical History, Past Surgical History, Family History, and Social History were reviewed in [employer] record.  ROS  The following are not active complaints unless bolded Hoarseness, sore throat, dysphagia, dental problems, itching, sneezing,  nasal congestion or discharge of excess mucus or purulent secretions, ear ache,   fever, chills, sweats, unintended wt loss or wt gain, classically pleuritic or exertional cp,  orthopnea pnd or arm/hand swelling  or leg swelling, presyncope, palpitations, abdominal pain, anorexia, nausea, vomiting, diarrhea  or change in bowel habits or change in  bladder habits, change in stools or change in urine, dysuria, hematuria,  rash, arthralgias, visual complaints, headache, numbness, weakness or ataxia or problems with walking or coordination,  change in mood or  memory.        Current Meds  Medication Sig   albuterol (PROAIR HFA) 108 (90 Base) MCG/ACT inhaler 2 puffs every 4 hours as needed only  if your can't catch your breath   amantadine (SYMMETREL) 100 MG capsule Take 100 mg by mouth 2 (two) times daily.   baclofen (LIORESAL) 10 MG tablet Take 20 mg by mouth 3 (three) times daily.   carvedilol (COREG) 6.25 MG tablet Take 6.25 mg by mouth 2 (two) times daily with a meal.   citalopram (CELEXA) 20 MG tablet Take 20 mg by mouth at bedtime.   diphenhydrAMINE (BENADRYL) 25 MG tablet Take 25 mg by mouth every 6 (six) hours as needed for allergies.   hydrochlorothiazide (HYDRODIURIL) 25 MG tablet Take 25 mg by mouth daily.   losartan (COZAAR) 100 MG tablet Take 100 mg by mouth daily.   mirabegron ER (MYRBETRIQ) 25 MG TB24 tablet Take 25 mg by mouth daily.   naproxen (NAPROSYN) 500 MG tablet Take 500 mg by mouth 2 (two) times daily as needed for moderate pain.    pantoprazole (PROTONIX) 40 MG tablet Take 30- 60 min before your first and last meals of the day   Polyethyl Glycol-Propyl Glycol 0.4-0.3 % SOLN Place 1-2 drops into both eyes 3 (three) times daily as needed (for dry eyes.).   Pregabalin ER 165 MG TB24 Take 165 mg by mouth 3 (three) times daily.   Prucalopride Succinate (MOTEGRITY) 2 MG TABS Take 1 tablet by mouth daily.   simvastatin (ZOCOR) 40 MG tablet Take 40 mg by mouth daily.   traZODone (DESYREL) 100 MG tablet Take 50 mg by mouth at bedtime.   VICTOZA 18 MG/3ML SOPN Inject 1.8 mg into the skin daily.   Vitamin D, Ergocalciferol, (DRISDOL) 1.25 MG (50000 UT) CAPS capsule Take 50,000 Units by mouth every Tuesday.                   Past Medical History:  Diagnosis Date   Barrett esophagus    Chronic pain    Depression     GERD (gastroesophageal reflux disease)    HTN (hypertension)    Hypercholesterolemia    MS (multiple sclerosis) (HCC)    RLS (restless legs syndrome)    Sleep apnea    not using CPAP; cannot tolerate, PCP not aware.       Objective:    Wts  05/02/2022       216   01/19/20 222 lb (100.7 kg)  09/28/19  221 lb (100.2 kg)  01/26/19 201 lb 9.6 oz (91.4 kg)     Vital signs reviewed  05/02/2022  - Note at rest 02 sats  97% on RA   General appearance:    amb mod obese bf nad     HEENT : Oropharynx  clear/ M1        NECK :  without  apparent JVD/ palpable Nodes/TM    LUNGS: no acc muscle use,  Nl contour chest which is clear to A and P bilaterally without cough on insp or exp maneuvers   CV:  RRR  no s3 or murmur or increase in P2, and no edema   ABD:  obese soft and nontender with nl inspiratory excursion in the supine position. No bruits or organomegaly appreciated   MS:  Nl gait/ ext warm without deformities Or obvious joint restrictions  calf tenderness, cyanosis or clubbing    SKIN: warm and dry without lesions    NEURO:  alert, approp, nl sensorium with  no motor or cerebellar deficits apparent.        Assessment

## 2022-05-02 ENCOUNTER — Encounter: Payer: Self-pay | Admitting: Internal Medicine

## 2022-05-02 ENCOUNTER — Ambulatory Visit (INDEPENDENT_AMBULATORY_CARE_PROVIDER_SITE_OTHER): Payer: BC Managed Care – PPO | Admitting: Internal Medicine

## 2022-05-02 VITALS — BP 128/70 | HR 84 | Ht 64.0 in | Wt 216.2 lb

## 2022-05-02 DIAGNOSIS — R0683 Snoring: Secondary | ICD-10-CM

## 2022-05-02 DIAGNOSIS — R0609 Other forms of dyspnea: Secondary | ICD-10-CM

## 2022-05-02 DIAGNOSIS — G4710 Hypersomnia, unspecified: Secondary | ICD-10-CM

## 2022-05-02 NOTE — Patient Instructions (Addendum)
We will order you a Home Sleep Study.  To get the most out of exercise, you need to be continuously aware that you are short of breath, but never out of breath, for at least 30 minutes daily. As you improve, it will actually be easier for you to do the same amount of exercise  in  30 minutes so always push to the level where you are short of breath.     Pulmonary follow up is as needed   Sleep medicine will be arranged after you do your test if needed

## 2022-05-03 DIAGNOSIS — G4710 Hypersomnia, unspecified: Secondary | ICD-10-CM | POA: Insufficient documentation

## 2022-05-03 NOTE — Assessment & Plan Note (Addendum)
HST ordered 05/02/2022   Despite wt issues she has a normal appearing airway > proceed with HST  Etiology and pathophysiology of osa including relationship to obesity reviewed in detail    Discussed in detail all the  indications, usual  risks and alternatives  relative to the benefits with patient who agrees to proceed with w/u as outlined.           Each maintenance medication was reviewed in detail including emphasizing most importantly the difference between maintenance and prns and under what circumstances the prns are to be triggered using an action plan format where appropriate.  Total time for H and P, chart review, counseling,  and generating customized AVS unique to this office visit / same day charting > 30 min re-establish ov

## 2022-05-03 NOTE — Assessment & Plan Note (Signed)
Onset Jan 2021 p covid rx as outpt with pred/ abx/ saba  -  09/28/2019   Walked RA  approx   500 ft  @ moderate pace  stopped due to end of study, min sob with sats 98%  -  PFTs 12/25/19   Nl x for ERV 30%   Improving with regular ex, no further f/u needed

## 2022-05-17 DIAGNOSIS — G8929 Other chronic pain: Secondary | ICD-10-CM | POA: Insufficient documentation

## 2022-05-29 ENCOUNTER — Encounter (INDEPENDENT_AMBULATORY_CARE_PROVIDER_SITE_OTHER): Payer: BC Managed Care – PPO

## 2022-05-29 DIAGNOSIS — G4733 Obstructive sleep apnea (adult) (pediatric): Secondary | ICD-10-CM

## 2022-05-29 DIAGNOSIS — R0683 Snoring: Secondary | ICD-10-CM

## 2022-09-21 ENCOUNTER — Encounter: Payer: Self-pay | Admitting: Pulmonary Disease

## 2022-09-21 ENCOUNTER — Ambulatory Visit (INDEPENDENT_AMBULATORY_CARE_PROVIDER_SITE_OTHER): Payer: BC Managed Care – PPO | Admitting: Pulmonary Disease

## 2022-09-21 VITALS — BP 128/83 | HR 82 | Ht 64.0 in | Wt 192.0 lb

## 2022-09-21 DIAGNOSIS — N951 Menopausal and female climacteric states: Secondary | ICD-10-CM | POA: Insufficient documentation

## 2022-09-21 DIAGNOSIS — G4733 Obstructive sleep apnea (adult) (pediatric): Secondary | ICD-10-CM | POA: Diagnosis not present

## 2022-09-21 DIAGNOSIS — J309 Allergic rhinitis, unspecified: Secondary | ICD-10-CM | POA: Insufficient documentation

## 2022-09-21 DIAGNOSIS — E782 Mixed hyperlipidemia: Secondary | ICD-10-CM | POA: Insufficient documentation

## 2022-09-21 DIAGNOSIS — G4701 Insomnia due to medical condition: Secondary | ICD-10-CM | POA: Diagnosis not present

## 2022-09-21 DIAGNOSIS — N6019 Diffuse cystic mastopathy of unspecified breast: Secondary | ICD-10-CM | POA: Insufficient documentation

## 2022-09-21 DIAGNOSIS — N941 Unspecified dyspareunia: Secondary | ICD-10-CM | POA: Insufficient documentation

## 2022-09-21 DIAGNOSIS — Z90710 Acquired absence of both cervix and uterus: Secondary | ICD-10-CM | POA: Insufficient documentation

## 2022-09-21 DIAGNOSIS — E785 Hyperlipidemia, unspecified: Secondary | ICD-10-CM | POA: Insufficient documentation

## 2022-09-21 DIAGNOSIS — G5600 Carpal tunnel syndrome, unspecified upper limb: Secondary | ICD-10-CM | POA: Insufficient documentation

## 2022-09-21 DIAGNOSIS — Z6834 Body mass index (BMI) 34.0-34.9, adult: Secondary | ICD-10-CM | POA: Insufficient documentation

## 2022-09-21 DIAGNOSIS — B369 Superficial mycosis, unspecified: Secondary | ICD-10-CM | POA: Insufficient documentation

## 2022-09-21 DIAGNOSIS — R011 Cardiac murmur, unspecified: Secondary | ICD-10-CM | POA: Insufficient documentation

## 2022-09-21 DIAGNOSIS — E049 Nontoxic goiter, unspecified: Secondary | ICD-10-CM | POA: Insufficient documentation

## 2022-09-21 NOTE — Progress Notes (Signed)
Subjective:    Patient ID: Madison Oliver, female    DOB: [birth year], 52 y.o.   MRN: [id number]  HPI  52 year old woman referred for eval ration of sleep disordered breathing by Dr. Sherene Sires  PMH : GERD/Barrett's  MS dx 2008 , now disabled CNA Diabetes type 2 Hypertension  Her chief complaint is that her sleep is interrupted by frequent awakenings.  She sleeps with 2 hours she is awake for about an hour repeats itself.  Her legs kick and the medication seems to help.  She takes Lyrica thrice daily, baclofen thrice daily and trazodone 50 mg at bedtime. Epworth sleepiness score is 14 and she reports sleepiness while sitting and reading, watching TV or sitting inactive in a public place. Bedtime is between 10 and 11 PM, sleep latency about an hour, TV stays on many times when she wakes up she will cut the TV back on, she sleeps on her side with 2 pillows, reports 3-4 nocturnal awakenings and nocturia and is out of bed between 7:30 AM and 9 AM feeling tired, dryness of mouth, denies headaches. There is no history suggestive of cataplexy, sleep paralysis or parasomnias   She has lost 23 pounds since the sleep study with dietary control and using ozempic   Significant tests/ events reviewed  HST 05/2022 SNAP>> very mild OSA with AHI of 5.3/hour, low sat 88%, TST 303 minutes, weight 2 1 5  pounds  - PFTs 12/25/19 Nl x for ERV 30%    Past Medical History:  Diagnosis Date   Barrett esophagus    Chronic pain    Depression    GERD (gastroesophageal reflux disease)    HTN (hypertension)    Hypercholesterolemia    MS (multiple sclerosis) (HCC)    RLS (restless legs syndrome)    Sleep apnea    not using CPAP; cannot tolerate, PCP not aware.    Past Surgical History:  Procedure Laterality Date   ABDOMINAL HYSTERECTOMY     still has ovaries   BIOPSY  01/08/2017   Procedure: BIOPSY;  Surgeon: West Bali, MD;  [facility];  Service: Endoscopy;;  gastric   CHOLECYSTECTOMY  N/A 08/13/2018   Procedure: LAPAROSCOPIC CHOLECYSTECTOMY;  Surgeon: Franky Macho, MD;  Location: [facility];  Service: General;  Laterality: N/A;   COLONOSCOPY WITH PROPOFOL N/A 01/08/2017   Redundant left colon, otherwise normal, external/internal hemorrhoids   ESOPHAGOGASTRODUODENOSCOPY  2016   outside facility:  hiatal hernia, gastirtis, esophageal stricture. path with benign fragments of duodenum without pathological change.    ESOPHAGOGASTRODUODENOSCOPY (EGD) WITH PROPOFOL N/A 01/08/2017   Benign-appearing esophageal stricture due to GERD s/p dilation, small hiatal hernia, mild gastritis/duodenitis due to Naproxen   SAVORY DILATION N/A 01/08/2017   Procedure: SAVORY DILATION;  Surgeon: West Bali, MD;  [facility];  Service: Endoscopy;  Laterality: N/A;   TONSILLECTOMY     TUBAL LIGATION      Allergies  Allergen Reactions   Prednisone Shortness Of Breath    Itching, palpitations   Penicillins Other (See Comments)    Stomach aching Did it involve swelling of the face/tongue/throat, SOB, or low BP? No Did it involve sudden or severe rash/hives, skin peeling, or any reaction on the inside of your mouth or nose? No Did you need to seek medical attention at a hospital or doctor's office? No When did it last happen?      20 + years If all above answers are "NO", may proceed with cephalosporin use.  Tizanidine Swelling    Swollen throat/difficulty swallowing.   Methylprednisolone Itching    Social History   Socioeconomic History   Marital status: Married    Spouse name: Not on file   Number of children: Not on file   Years of education: Not on file   Highest education level: Not on file  Occupational History   Occupation: disability  Tobacco Use   Smoking status: Never   Smokeless tobacco: Never  Vaping Use   Vaping status: Never Used  Substance and Sexual Activity   Alcohol use: No   Drug use: No   Sexual activity: Yes    Birth control/protection: Surgical   Other Topics Concern   Not on file  Social History Narrative   Not on file   Social Determinants of Health   Financial Resource Strain: Not on file  Food Insecurity: Not on file  Transportation Needs: Not on file  Physical Activity: Not on file  Stress: Not on file  Social Connections: Not on file  Intimate Partner Violence: Not on file      Review of Systems Constitutional: negative for anorexia, fevers and sweats  Eyes: negative for irritation, redness and visual disturbance  Ears, nose, mouth, throat, and face: negative for earaches, epistaxis, nasal congestion and sore throat  Respiratory: negative for cough, dyspnea on exertion, sputum and wheezing  Cardiovascular: negative for chest pain, dyspnea, lower extremity edema, orthopnea, palpitations and syncope  Gastrointestinal: negative for abdominal pain, constipation, diarrhea, melena, nausea and vomiting  Genitourinary:negative for dysuria, frequency and hematuria  Hematologic/lymphatic: negative for bleeding, easy bruising and lymphadenopathy  Musculoskeletal:negative for arthralgias, muscle weakness and stiff joints  Neurological: negative for coordination problems, gait problems, headaches and weakness  Endocrine: negative for diabetic symptoms including polydipsia, polyuria and weight loss     Objective:   Physical Exam  Gen. Pleasant, obese, in no distress, normal affect ENT - no pallor,icterus, no post nasal drip, class 2-3 airway Neck: No JVD, no thyromegaly, no carotid bruits Lungs: no use of accessory muscles, no dullness to percussion, decreased without rales or rhonchi  Cardiovascular: Rhythm regular, heart sounds  normal, no murmurs or gallops, no peripheral edema Abdomen: soft and non-tender, no hepatosplenomegaly, BS normal. Musculoskeletal: No deformities, no cyanosis or clubbing Neuro:  alert, non focal, no tremors       Assessment & Plan:

## 2022-09-21 NOTE — Patient Instructions (Signed)
You have very mild sleep apnea which may have been corrected already by your weight loss ! Maintain current weight   Rules of sleep hygiene were discussed  - light exercise -avoid caffeinated beverages - no more than 20 mins staying awake in bed, if not asleep, get out of bed & reading or light music - No TV or phone screens  at bedtime.

## 2022-09-21 NOTE — Assessment & Plan Note (Signed)
Mainly related to poor sleep hygiene and also due to medical condition/multiple sclerosis Currently issues in spite of heavy doses of baclofen, Lyrica and trazodone 50 mg.  I would refrain from giving her more medications  Rules of sleep hygiene were discussed  - light exercise -avoid caffeinated beverages - no more than 20 mins staying awake in bed, if not asleep, get out of bed & reading or light music - No TV or phone screens  at bedtime.

## 2022-09-21 NOTE — Assessment & Plan Note (Signed)
Very mild with AHI 5.3 and minimal desaturation.  This was at a weight of 2 1 5  pounds.  Since then she has lost significant weight to her current weight of 192 pounds and I feel that OSA would have corrected No other treatment is necessary other than maintaining current weight

## 2023-09-23 ENCOUNTER — Ambulatory Visit

## 2023-09-23 VITALS — BP 122/83 | HR 76 | Ht 64.0 in | Wt 217.0 lb

## 2023-09-23 DIAGNOSIS — G4733 Obstructive sleep apnea (adult) (pediatric): Secondary | ICD-10-CM

## 2023-09-23 DIAGNOSIS — G2581 Restless legs syndrome: Secondary | ICD-10-CM

## 2023-09-23 DIAGNOSIS — E66812 Obesity, class 2: Secondary | ICD-10-CM

## 2023-09-23 DIAGNOSIS — J4530 Mild persistent asthma, uncomplicated: Secondary | ICD-10-CM

## 2023-09-23 DIAGNOSIS — Z6837 Body mass index (BMI) 37.0-37.9, adult: Secondary | ICD-10-CM

## 2023-09-23 DIAGNOSIS — E6609 Other obesity due to excess calories: Secondary | ICD-10-CM

## 2023-09-23 MED ORDER — BUDESONIDE-FORMOTEROL FUMARATE 160-4.5 MCG/ACT IN AERO: 2.0000 | INHALATION_SPRAY | Freq: Two times a day (BID) | RESPIRATORY_TRACT | 6 refills | Status: AC

## 2023-09-23 MED ORDER — ALBUTEROL SULFATE HFA 108 (90 BASE) MCG/ACT IN AERS: INHALATION_SPRAY | RESPIRATORY_TRACT | 1 refills | Status: AC

## 2023-09-23 MED ORDER — ALBUTEROL SULFATE HFA 108 (90 BASE) MCG/ACT IN AERS
2.0000 | INHALATION_SPRAY | Freq: Four times a day (QID) | RESPIRATORY_TRACT | 6 refills | Status: AC | PRN
Start: 2023-09-23 — End: ?

## 2023-09-23 NOTE — Progress Notes (Signed)
 New Patient Pulmonology Office Visit   Subjective:  Patient ID: Madison Oliver, female    DOB: Aug 13, 1970  MRN: [id number]  Referred by: Johnson Morna FALCON, NP  CC:  Chief Complaint  Patient presents with   Medical Management of Chronic Issues    PT states over due follow/ refills     HPI Madison Oliver is a 53 y.o. female with MDD, MS and RLS follows neurology on Lyrica, Insomnia follows psych on trazodone. GERD and Barretts esophagus. Follows Dr Darlean: was on albuterol . Helps her. Had COVID in 2021. PFTs 2021: normal w/o BD response. ?RAD improved w albuterol   Saw Dr Jude for sleep. HST 05/2022 w mild OSA ahi 5.3/hr. Was on CPAP years ago. Weight loss from 215>192. Now has gained it back and now at 217lbs.     DOE for last 1 month. Cough w clear phlegm. Has post nasal gtt. Wheezing last 2 weeks. No chest tightness. May cough at night prior to 1 month.  Summertime seasonal allergies. Feels have itchy eyes and nasal drainage.  Smoke, perfume smells may cause SOB.  4 a day albuterol  inh in last month. Prior 1-2 times/week. Poor technique.    Snores a lot since weight gain.  Feels tired during the day. No naps in the afternoon. Falls asleep while watching TV or talking. Sleepy while driving sometimes.  Bedtime 10 pm, immediately. Uses trazodone 25mg . Helps. Wakes up once/night. Wakes: 7 am. Feels refreshed usually.  RLS: usually at night. Takes lyrica around 8 pm. Feels that RLS is controlled w it.    No Fam Hx of asthma.   Lung Health: Functional status: 1 block. Limited due to leg pain from MS.  Covid vaccine: 2 Influenza vaccine: NA.  Pneumonococcal vaccine: never.  Smoking: non smoker.  Occupational exposure/pets: no pets. 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       No data to display          Allergies: Prednisone, Penicillins, Tizanidine, and Methylprednisolone  Current Outpatient Medications:    albuterol  (VENTOLIN  HFA) 108 (90 Base) MCG/ACT inhaler,  Inhale 2 puffs into the lungs every 6 (six) hours as needed for wheezing or shortness of breath., Disp: 8.5 g, Rfl: 6   amantadine (SYMMETREL) 100 MG capsule, Take 100 mg by mouth 2 (two) times daily., Disp: , Rfl:    amitriptyline (ELAVIL) 25 MG tablet, Take 25 mg by mouth at bedtime., Disp: , Rfl:    atorvastatin (LIPITOR) 80 MG tablet, Take 80 mg by mouth daily., Disp: , Rfl:    baclofen (LIORESAL) 10 MG tablet, Take 20 mg by mouth 3 (three) times daily., Disp: , Rfl:    budesonide -formoterol  (SYMBICORT ) 160-4.5 MCG/ACT inhaler, Inhale 2 puffs into the lungs in the morning and at bedtime., Disp: 1 each, Rfl: 6   carvedilol (COREG) 6.25 MG tablet, Take 6.25 mg by mouth 2 (two) times daily with a meal., Disp: , Rfl:    citalopram (CELEXA) 20 MG tablet, Take 20 mg by mouth at bedtime., Disp: , Rfl:    diphenhydrAMINE (BENADRYL) 25 MG tablet, Take 25 mg by mouth every 6 (six) hours as needed for allergies., Disp: , Rfl:    hydrochlorothiazide (HYDRODIURIL) 25 MG tablet, Take 25 mg by mouth daily., Disp: , Rfl:    losartan (COZAAR) 100 MG tablet, Take 100 mg by mouth daily., Disp: , Rfl:    losartan-hydrochlorothiazide (HYZAAR) 100-25 MG tablet, Take 1 tablet by mouth daily., Disp: , Rfl:  naproxen (NAPROSYN) 500 MG tablet, Take 500 mg by mouth 2 (two) times daily as needed for moderate pain. , Disp: , Rfl:    OZEMPIC, 1 MG/DOSE, 4 MG/3ML SOPN, SMARTSIG:1 Milligram(s) SUB-Q Every 4 Weeks, Disp: , Rfl:    pantoprazole  (PROTONIX ) 40 MG tablet, Take 30- 60 min before your first and last meals of the day, Disp: 180 tablet, Rfl: 3   Polyethyl Glycol-Propyl Glycol 0.4-0.3 % SOLN, Place 1-2 drops into both eyes 3 (three) times daily as needed (for dry eyes.)., Disp: , Rfl:    Pregabalin ER 165 MG TB24, Take 165 mg by mouth 3 (three) times daily., Disp: , Rfl:    simvastatin (ZOCOR) 40 MG tablet, Take 40 mg by mouth daily., Disp: , Rfl:    traZODone (DESYREL) 100 MG tablet, Take 50 mg by mouth at bedtime.,  Disp: , Rfl:    Vitamin D, Ergocalciferol, (DRISDOL) 1.25 MG (50000 UT) CAPS capsule, Take 50,000 Units by mouth every Tuesday., Disp: , Rfl:    albuterol  (PROAIR  HFA) 108 (90 Base) MCG/ACT inhaler, 2 puffs every 4 hours as needed only  if your can't catch your breath, Disp: 17 g, Rfl: 1 Past Medical History:  Diagnosis Date   Barrett esophagus    Chronic pain    Depression    GERD (gastroesophageal reflux disease)    HTN (hypertension)    Hypercholesterolemia    MS (multiple sclerosis) (HCC)    RLS (restless legs syndrome)    Sleep apnea    not using CPAP; cannot tolerate, PCP not aware.   Past Surgical History:  Procedure Laterality Date   ABDOMINAL HYSTERECTOMY     still has ovaries   BIOPSY  01/08/2017   Procedure: BIOPSY;  Surgeon: Harvey Margo CROME, MD;  [facility];  Service: Endoscopy;;  gastric   CHOLECYSTECTOMY N/A 08/13/2018   Procedure: LAPAROSCOPIC CHOLECYSTECTOMY;  Surgeon: Mavis Anes, MD;  Location: [facility];  Service: General;  Laterality: N/A;   COLONOSCOPY WITH PROPOFOL  N/A 01/08/2017   Redundant left colon, otherwise normal, external/internal hemorrhoids   ESOPHAGOGASTRODUODENOSCOPY  2016   outside facility:  hiatal hernia, gastirtis, esophageal stricture. path with benign fragments of duodenum without pathological change.    ESOPHAGOGASTRODUODENOSCOPY (EGD) WITH PROPOFOL  N/A 01/08/2017   Benign-appearing esophageal stricture due to GERD s/p dilation, small hiatal hernia, mild gastritis/duodenitis due to Naproxen   SAVORY DILATION N/A 01/08/2017   Procedure: SAVORY DILATION;  Surgeon: Harvey Margo CROME, MD;  [facility];  Service: Endoscopy;  Laterality: N/A;   TONSILLECTOMY     TUBAL LIGATION     Family History  Problem Relation Age of Onset   Colon polyps Mother 71   Colon cancer Neg Hx    Social History   Socioeconomic History   Marital status: Married    Spouse name: Not on file   Number of children: Not on file   Years of education:  Not on file   Highest education level: Not on file  Occupational History   Occupation: disability  Tobacco Use   Smoking status: Never   Smokeless tobacco: Never  Vaping Use   Vaping status: Never Used  Substance and Sexual Activity   Alcohol use: No   Drug use: No   Sexual activity: Yes    Birth control/protection: Surgical  Other Topics Concern   Not on file  Social History Narrative   Not on file   Social Drivers of Health   Financial Resource Strain: Not on file  Food Insecurity: Not on file  Transportation Needs: Not on file  Physical Activity: Not on file  Stress: Not on file  Social Connections: Not on file  Intimate Partner Violence: Not on file       Objective:  BP 122/83   Pulse 76   Ht 5' 4 (1.626 m)   Wt 217 lb (98.4 kg)   SpO2 96%   BMI 37.25 kg/m  BMI Readings from Last 3 Encounters:  09/23/23 37.25 kg/m  09/21/22 32.96 kg/m  05/02/22 37.11 kg/m   General: Not in distress patient, obese. MMS 4. No nasal drainage Lungs: clear to auscultation bilaterally.  Heart: regular rate rhythm, no murmur appreciated.  Abdomen: non tender, non distended. Normal BS.  Neuro: axo3. Moving all 4 extremities.   Diagnostic Review:  Last metabolic panel Lab Results  Component Value Date   GLUCOSE 110 (H) 08/07/2018   NA 139 08/07/2018   K 3.5 08/07/2018   CL 101 08/07/2018   CO2 23 08/07/2018   BUN 12 08/07/2018   CREATININE 0.52 08/07/2018   GFRNONAA >60 08/07/2018   CALCIUM 8.9 08/07/2018   PROT 7.2 08/07/2018   ALBUMIN 4.1 08/07/2018   BILITOT 0.3 08/07/2018   ALKPHOS 59 08/07/2018   AST 22 08/07/2018   ALT 22 08/07/2018   ANIONGAP 15 08/07/2018       Assessment & Plan:   Assessment & Plan Mild persistent asthma, unspecified whether complicated RAD: Likely triggered after COVID.  Started symbicort  and prn albuterol .  Spacer given. Poor inh technique. Counselling for inahlers. Gargle after steroid symbicort  use.  Class 2 obesity due to  excess calories without serious comorbidity with body mass index (BMI) of 37.0 to 37.9 in adult Prior ozempic. Asked to ask PCP about ozempic. If unable to do it we can try to get her scheduled with weight loss program in [place].  OSA (obstructive sleep apnea) May have OSA. HST ordered. If neg HST will get [facility] PSG.  Restless leg On Lyrica. Was on gabapentin prior. Currently controlled. Iron panel ordered.   Orders Placed This Encounter  Procedures   Iron, TIBC and Ferritin Panel   Pulmonary function test   Home sleep test    Return in 6 weeks (on 11/04/2023) for sch after PFTs, Sch after CT scan.   Celester Morgan, MD

## 2023-09-23 NOTE — Addendum Note (Signed)
 Addended by: ARMAND BURNARD SAUNDERS on: 09/23/2023 09:48 AM   Modules accepted: Orders

## 2023-09-23 NOTE — Addendum Note (Signed)
 Addended by: Brihanna Devenport M on: 09/23/2023 09:54 AM   Modules accepted: Orders

## 2023-09-23 NOTE — Patient Instructions (Signed)
 Notification of test results are managed in the following manner: If there are any recommendations or changes to the plan of care discussed in office today, we will contact you and let you know what they are. If you do not hear from us , then your results are normal/expected and you can view them through your MyChart account, or a letter will be sent to you. Thank you again for trusting us  with your care [facility].

## 2023-10-22 ENCOUNTER — Encounter

## 2023-10-24 NOTE — Progress Notes (Deleted)
 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 Trudy. Pt c/o DOE since had Covid 24 Feb 2019. She was winded walking from lobby to exam room today. She states that occ at night when she lies down she can hear wheezing.   Dyspnea:  50 ft doe but also legs get tired about the same time (see walk in clinic x 500 ft)  Cough: at hs with overt gag/ vomit  sssoc subj wheeze on protonix  40 mg pc qam  Sleep: no elevation/ 2 pillows on side  SABA use: none  Rec Protonix  40 mg Take 30- 60 min before your first and last meals of the day  GERD diet   To get the most out of exercise, you need to be continuously aware that you are short of breath  Try albuterol  15 min before an activity that you know would make you short of breath and see if it makes any difference and if makes none then don't take it after activity unless you can't catch your breath. Please schedule a follow up visit in 3 months with pfts > 12/25/19   Nl x for ERV 30%     01/19/2020  f/u ov/[place] office/Nakkia Mackiewicz re: doe p covid/ no maint rx  Chief Complaint  Patient presents with   Follow-up    Breathing has improved slightly since the last visit. She had PFT done 12/22/19. She is using her albuterol  inhaler 3 x per wk on average.   Dyspnea:  No regular ex / still has problem with steps  Cough: better on ppi qa m   Sleeping: on side/ no elevation  SABA use: rarely  02: none  Rec Try albuterol  15 min before an activity that you know would make you short of breath  Weight control is simply a matter of calorie balance If any night time symptoms of cough or gag add pepcid 20 mg an hour before bed and consider bed blocks 6-8 inches   If you are not satisfied after holidays please call to schedule a CPST > not done   05/02/2022  Re-establish ov/[place] office/Kelcey Wickstrom re: doe maint on no resp rx   Chief Complaint  Patient presents with   Follow-up    Questions about breathing pattern during sleep   Dyspnea:  improving and not needing much albuterol / walking 30  min  twice weekly  Cough: none  Sleeping: level bed / 2 pillows  2 months worse wakening  SABA use: only p ex once a week   10-7am not rested/moderate sleepiness daytime never driving Rec We will order you a Home Sleep Study. To get the most out of exercise, you need to be continuously aware that you are short of breath, but never out of breath, for at least 30  minutes daily.  Pulmonary follow up is as needed    HST  05/29/22  desats to 88% but not > 5 minutes and no significant OSA  10/25/2023  f/u ov/[place] office/Marsela Kuan re: doe  maint on ***  No chief complaint on file.   Dyspnea:  *** Cough: *** Sleeping: ***   resp cc  SABA use: *** 02: ***  Lung cancer screening: ***   No obvious day to day or daytime variability or assoc excess/ purulent sputum or mucus plugs or hemoptysis or cp or chest tightness, subjective wheeze or overt sinus or hb symptoms.    Also denies any obvious fluctuation of symptoms with weather or environmental changes or other aggravating or alleviating factors except as outlined above   No unusual exposure hx or h/o childhood pna/ asthma or knowledge of premature birth.  Current Allergies, Complete Past Medical History, Past Surgical History, Family History, and Social History were reviewed in [employer] record.  ROS  The following are not active complaints unless bolded Hoarseness, sore throat, dysphagia, dental problems, itching, sneezing,  nasal congestion or discharge of excess mucus or purulent secretions, ear ache,   fever, chills, sweats, unintended wt loss or wt gain, classically pleuritic or exertional cp,  orthopnea pnd or arm/hand swelling  or leg swelling, presyncope, palpitations, abdominal pain, anorexia, nausea, vomiting, diarrhea  or change in bowel habits or change in bladder habits, change in stools or change in urine, dysuria, hematuria,  rash, arthralgias, visual complaints, headache, numbness, weakness or ataxia or problems with walking or coordination,  change in mood or  memory.        No outpatient medications have been marked as taking for the 10/25/23 encounter (Appointment) with Cejay Cambre B, MD.                   Past Medical History:  Diagnosis Date   Barrett esophagus    Chronic pain    Depression    GERD (gastroesophageal reflux disease)    HTN (hypertension)    Hypercholesterolemia    MS (multiple sclerosis) (HCC)    RLS (restless legs syndrome)    Sleep apnea    not using CPAP; cannot tolerate, PCP not aware.       Objective:    Wts  10/25/2023        ***  05/02/2022       216   01/19/20 222 lb (100.7 kg)  09/28/19 221 lb (100.2 kg)  01/26/19 201 lb 9.6 oz (91.4 kg)       Vital signs reviewed  10/25/2023  - Note at rest 02 sats  ***% on ***   General appearance:    ***              Assessment

## 2023-10-25 ENCOUNTER — Encounter: Payer: Self-pay | Admitting: Internal Medicine

## 2023-10-25 ENCOUNTER — Ambulatory Visit: Admitting: Internal Medicine

## 2023-10-25 DIAGNOSIS — J4530 Mild persistent asthma, uncomplicated: Secondary | ICD-10-CM
